# Patient Record
Sex: Male | Born: 1955 | Race: Black or African American | Hispanic: No | Marital: Married | Smoking: Former smoker
Health system: Southern US, Community
[De-identification: ages and names within clinical notes are randomized; demographics above are authoritative.]

## PROBLEM LIST (undated history)

## (undated) DIAGNOSIS — K449 Diaphragmatic hernia without obstruction or gangrene: Secondary | ICD-10-CM

## (undated) DIAGNOSIS — I10 Essential (primary) hypertension: Secondary | ICD-10-CM

## (undated) DIAGNOSIS — K589 Irritable bowel syndrome without diarrhea: Secondary | ICD-10-CM

## (undated) DIAGNOSIS — F32A Depression, unspecified: Secondary | ICD-10-CM

## (undated) DIAGNOSIS — F419 Anxiety disorder, unspecified: Secondary | ICD-10-CM

## (undated) DIAGNOSIS — K219 Gastro-esophageal reflux disease without esophagitis: Secondary | ICD-10-CM

## (undated) DIAGNOSIS — E785 Hyperlipidemia, unspecified: Secondary | ICD-10-CM

## (undated) DIAGNOSIS — K2970 Gastritis, unspecified, without bleeding: Secondary | ICD-10-CM

## (undated) DIAGNOSIS — R011 Cardiac murmur, unspecified: Secondary | ICD-10-CM

## (undated) DIAGNOSIS — F329 Major depressive disorder, single episode, unspecified: Secondary | ICD-10-CM

## (undated) DIAGNOSIS — E119 Type 2 diabetes mellitus without complications: Secondary | ICD-10-CM

## (undated) DIAGNOSIS — N529 Male erectile dysfunction, unspecified: Secondary | ICD-10-CM

## (undated) HISTORY — DX: Type 2 diabetes mellitus without complications: E11.9

## (undated) HISTORY — DX: Irritable bowel syndrome, unspecified: K58.9

## (undated) HISTORY — DX: Major depressive disorder, single episode, unspecified: F32.9

## (undated) HISTORY — PX: ESOPHAGOGASTRODUODENOSCOPY: SHX1529

## (undated) HISTORY — DX: Male erectile dysfunction, unspecified: N52.9

## (undated) HISTORY — DX: Cardiac murmur, unspecified: R01.1

## (undated) HISTORY — DX: Gastritis, unspecified, without bleeding: K29.70

## (undated) HISTORY — DX: Hyperlipidemia, unspecified: E78.5

## (undated) HISTORY — DX: Depression, unspecified: F32.A

## (undated) HISTORY — DX: Essential (primary) hypertension: I10

## (undated) HISTORY — DX: Gastro-esophageal reflux disease without esophagitis: K21.9

## (undated) HISTORY — PX: CARDIOVASCULAR STRESS TEST: SHX262

## (undated) HISTORY — DX: Anxiety disorder, unspecified: F41.9

## (undated) HISTORY — DX: Diaphragmatic hernia without obstruction or gangrene: K44.9

---

## 2003-01-27 ENCOUNTER — Encounter: Payer: Self-pay | Admitting: Internal Medicine

## 2004-09-14 ENCOUNTER — Ambulatory Visit: Payer: Self-pay | Admitting: Internal Medicine

## 2004-12-02 ENCOUNTER — Ambulatory Visit: Payer: Self-pay | Admitting: Internal Medicine

## 2004-12-14 ENCOUNTER — Ambulatory Visit: Payer: Self-pay | Admitting: Internal Medicine

## 2004-12-16 ENCOUNTER — Ambulatory Visit: Payer: Self-pay | Admitting: Critical Care Medicine

## 2005-02-01 ENCOUNTER — Ambulatory Visit: Payer: Self-pay | Admitting: Internal Medicine

## 2005-02-16 ENCOUNTER — Ambulatory Visit: Payer: Self-pay | Admitting: Critical Care Medicine

## 2005-03-16 ENCOUNTER — Ambulatory Visit: Payer: Self-pay | Admitting: Critical Care Medicine

## 2005-03-17 ENCOUNTER — Encounter: Payer: Self-pay | Admitting: Internal Medicine

## 2005-03-27 ENCOUNTER — Ambulatory Visit: Payer: Self-pay | Admitting: Internal Medicine

## 2005-08-30 ENCOUNTER — Ambulatory Visit: Payer: Self-pay | Admitting: Internal Medicine

## 2005-09-05 ENCOUNTER — Ambulatory Visit (HOSPITAL_COMMUNITY): Admission: RE | Admit: 2005-09-05 | Discharge: 2005-09-05 | Payer: Self-pay | Admitting: Gastroenterology

## 2005-09-05 ENCOUNTER — Ambulatory Visit: Payer: Self-pay | Admitting: Gastroenterology

## 2005-09-05 ENCOUNTER — Encounter: Payer: Self-pay | Admitting: Internal Medicine

## 2005-09-28 ENCOUNTER — Ambulatory Visit: Payer: Self-pay | Admitting: Gastroenterology

## 2005-09-28 ENCOUNTER — Encounter (INDEPENDENT_AMBULATORY_CARE_PROVIDER_SITE_OTHER): Payer: Self-pay | Admitting: Specialist

## 2005-09-28 DIAGNOSIS — K449 Diaphragmatic hernia without obstruction or gangrene: Secondary | ICD-10-CM

## 2005-09-28 HISTORY — DX: Diaphragmatic hernia without obstruction or gangrene: K44.9

## 2005-10-12 ENCOUNTER — Ambulatory Visit: Payer: Self-pay | Admitting: Internal Medicine

## 2005-12-11 ENCOUNTER — Ambulatory Visit: Payer: Self-pay | Admitting: Internal Medicine

## 2006-04-13 ENCOUNTER — Ambulatory Visit: Payer: Self-pay | Admitting: Internal Medicine

## 2006-06-19 ENCOUNTER — Ambulatory Visit: Payer: Self-pay | Admitting: Internal Medicine

## 2007-05-10 DIAGNOSIS — F329 Major depressive disorder, single episode, unspecified: Secondary | ICD-10-CM

## 2007-05-10 DIAGNOSIS — K589 Irritable bowel syndrome without diarrhea: Secondary | ICD-10-CM

## 2007-05-10 DIAGNOSIS — E785 Hyperlipidemia, unspecified: Secondary | ICD-10-CM | POA: Insufficient documentation

## 2007-05-23 ENCOUNTER — Ambulatory Visit: Payer: Self-pay | Admitting: Internal Medicine

## 2007-05-23 DIAGNOSIS — R002 Palpitations: Secondary | ICD-10-CM | POA: Insufficient documentation

## 2007-05-23 DIAGNOSIS — F419 Anxiety disorder, unspecified: Secondary | ICD-10-CM | POA: Insufficient documentation

## 2007-05-25 ENCOUNTER — Encounter: Payer: Self-pay | Admitting: Internal Medicine

## 2007-09-13 ENCOUNTER — Ambulatory Visit: Payer: Self-pay | Admitting: Internal Medicine

## 2007-09-13 DIAGNOSIS — K625 Hemorrhage of anus and rectum: Secondary | ICD-10-CM

## 2007-11-04 ENCOUNTER — Telehealth (INDEPENDENT_AMBULATORY_CARE_PROVIDER_SITE_OTHER): Payer: Self-pay | Admitting: *Deleted

## 2008-07-28 ENCOUNTER — Ambulatory Visit: Payer: Self-pay | Admitting: Family Medicine

## 2008-07-28 DIAGNOSIS — R209 Unspecified disturbances of skin sensation: Secondary | ICD-10-CM

## 2008-07-28 DIAGNOSIS — IMO0002 Reserved for concepts with insufficient information to code with codable children: Secondary | ICD-10-CM | POA: Insufficient documentation

## 2008-07-28 LAB — CONVERTED CEMR LAB

## 2008-07-29 LAB — CONVERTED CEMR LAB
ALT: 36 units/L (ref 0–53)
Basophils Absolute: 0 10*3/uL (ref 0.0–0.1)
Basophils Relative: 0.7 % (ref 0.0–3.0)
Bilirubin, Direct: 0.1 mg/dL (ref 0.0–0.3)
CO2: 29 meq/L (ref 19–32)
Calcium: 9.1 mg/dL (ref 8.4–10.5)
Cholesterol, target level: 200 mg/dL
Cholesterol: 247 mg/dL (ref 0–200)
Creatinine, Ser: 0.9 mg/dL (ref 0.4–1.5)
Direct LDL: 175.9 mg/dL
Eosinophils Absolute: 0.2 10*3/uL (ref 0.0–0.7)
GFR calc Af Amer: 114 mL/min
GFR calc non Af Amer: 94 mL/min
HDL goal, serum: 40 mg/dL
HDL: 40.9 mg/dL (ref >39.0)
Hemoglobin: 14 g/dL (ref 13.0–17.0)
LDL Goal: 160 mg/dL
Lymphocytes Relative: 34.3 % (ref 12.0–46.0)
MCHC: 35.3 g/dL (ref 30.0–36.0)
MCV: 85.4 fL (ref 78.0–100.0)
Neutro Abs: 3.4 10*3/uL (ref 1.4–7.7)
PSA: 0.49 ng/mL (ref 0.10–4.00)
RBC: 4.64 M/uL (ref 4.22–5.81)
RDW: 12.9 % (ref 11.5–14.6)
Sodium: 139 meq/L (ref 135–145)
Total Bilirubin: 0.9 mg/dL (ref 0.3–1.2)
VLDL: 37 mg/dL (ref 0–40)

## 2008-12-14 ENCOUNTER — Telehealth: Payer: Self-pay | Admitting: Internal Medicine

## 2009-02-23 ENCOUNTER — Telehealth: Payer: Self-pay | Admitting: Internal Medicine

## 2009-12-28 ENCOUNTER — Ambulatory Visit: Payer: Self-pay | Admitting: Internal Medicine

## 2009-12-28 DIAGNOSIS — M25519 Pain in unspecified shoulder: Secondary | ICD-10-CM | POA: Insufficient documentation

## 2009-12-29 LAB — CONVERTED CEMR LAB: Free T4: 0.7 ng/dL (ref 0.6–1.6)

## 2010-05-05 ENCOUNTER — Ambulatory Visit: Payer: Self-pay | Admitting: Internal Medicine

## 2010-05-05 DIAGNOSIS — N529 Male erectile dysfunction, unspecified: Secondary | ICD-10-CM | POA: Insufficient documentation

## 2010-05-09 LAB — CONVERTED CEMR LAB
ALT: 29 units/L (ref 0–53)
AST: 26 units/L (ref 0–37)
Albumin: 4.4 g/dL (ref 3.5–5.2)
Basophils Absolute: 0 10*3/uL (ref 0.0–0.1)
Basophils Relative: 0.2 % (ref 0.0–3.0)
Calcium: 9.3 mg/dL (ref 8.4–10.5)
Creatinine, Ser: 0.9 mg/dL (ref 0.4–1.5)
Direct LDL: 116.6 mg/dL
Eosinophils Absolute: 0.2 10*3/uL (ref 0.0–0.7)
Glucose, Bld: 85 mg/dL (ref 70–99)
HDL: 39.8 mg/dL (ref >39.00)
Lymphocytes Relative: 35.7 % (ref 12.0–46.0)
MCHC: 33.5 g/dL (ref 30.0–36.0)
Neutrophils Relative %: 52.5 % (ref 43.0–77.0)
PSA: 0.47 ng/mL (ref 0.10–4.00)
Phosphorus: 3.4 mg/dL (ref 2.3–4.6)
Platelets: 220 10*3/uL (ref 150.0–400.0)
RBC: 4.4 M/uL (ref 4.22–5.81)
Sodium: 141 meq/L (ref 135–145)
TSH: 1.13 microintl units/mL (ref 0.35–5.50)
Total CHOL/HDL Ratio: 4
VLDL: 53 mg/dL — ABNORMAL HIGH (ref 0.0–40.0)

## 2010-10-13 ENCOUNTER — Encounter: Payer: Self-pay | Admitting: Internal Medicine

## 2010-10-13 DIAGNOSIS — Z0279 Encounter for issue of other medical certificate: Secondary | ICD-10-CM

## 2010-10-25 NOTE — Assessment & Plan Note (Signed)
Summary: ARM PAIN/CLE   Vital Signs:  Patient profile:   55 year old male Weight:      219 pounds BMI:     29.81 Temp:     98.2 degrees F oral Pulse rate:   80 / minute Pulse rhythm:   regular BP sitting:   128 / 80  (left arm) Cuff size:   large  Vitals Entered By: Mervin Hack CMA Duncan Dull) (December 28, 2009 9:06 AM) CC: arm pain   History of Present Illness: Having pain in left shoulder for 3-4 months constant but worsens with movement--esp outward Hard to hold any heavy objects Occ numbness in 4th and 5th fingers--esp when driving  No known injury No prior problems No swelling  Tried muscle rub, acetominophen---help some No arm weakness  Mood has been okay still on antidepressants  Stopped chol meds Rx ran out  Still has burning sensation in both thighs amitriptylline doesn't seem to help Not every day  Not exercising now has lots of fatigue does have elliptical but hasn't started using  Allergies: No Known Drug Allergies  Past History:  Past medical, surgical, family and social histories (including risk factors) reviewed for relevance to current acute and chronic problems.  Past Medical History: Reviewed history from 05/23/2007 and no changes required. Depression GERD Hyperlipidemia IBS Anxiety  Past Surgical History: Reviewed history from 05/10/2007 and no changes required. Stress test negative 06/04 PFT's- fairly normal 05/06 EGD- gastritis, hiatal hernia 01/07  Family History: Reviewed history from 05/10/2007 and no changes required. 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   Neck:  supple and no masses.  Mild restriction in extension Lungs:  normal respiratory effort and normal breath sounds.   Heart:  normal rate, regular rhythm, no murmur, and no gallop.   Msk:  left shoulder has slightly decreased passive abduction and posterior ROM No swelling No bursa tenderness No impingement Neurologic:  no arm weakness gait normal.   Psych:  normally interactive, good eye contact, not anxious appearing, and not depressed appearing.     Impression & Recommendations:  Problem # 1:  SHOULDER PAIN, LEFT (ICD-719.41) Assessment New mild restriction No evidence of serious injury will try heat and NSAIDs for 2 weeks PT eval if not improved  Problem # 2:  UNSPECIFIED NEURALGIA NEURITIS AND RADICULITIS (ICD-729.2) Assessment: Unchanged  intermittent will stop the amitriptylline since dry mouth is keeping him up will check thyroid and B12 just in case  Orders: Venipuncture ([address]) TLB-TSH (Thyroid Stimulating Hormone) (84443-TSH) TLB-B12, Serum-Total ONLY (60454-U98) TLB-T4 (Thyrox), Free [phone])  Problem # 3:  DEPRESSION (ICD-311) Assessment: Unchanged stable on current meds  The following medications were removed from the medication list:    Amitriptyline Hcl 25 Mg Tabs (Amitriptyline hcl) .Marland Kitchen... Take one tablet at bedtime His updated medication list for this problem includes:    Fluoxetine Hcl 40 Mg Caps (Fluoxetine hcl) .Marland Kitchen... Take one by mouth once a day    Lorazepam 0.5 Mg Tabs (Lorazepam) .Marland Kitchen... Take 1 tablet by  mouth twice a day  Problem # 4:  HYPERLIPIDEMIA (ICD-272.4) Assessment: Comment Only will restart meds check labs at physical  His updated medication list for this problem includes:    Simvastatin 40 Mg Tabs (Simvastatin) .Marland Kitchen... Take one tablet at bedtime  Complete Medication List: 1)  Fluoxetine Hcl 40 Mg Caps (Fluoxetine hcl) .... Take one  by mouth once a day 2)  Lorazepam 0.5 Mg Tabs (Lorazepam) .... Take 1 tablet by mouth twice a day 3)  Simvastatin 40 Mg Tabs (Simvastatin) .... Take one tablet at bedtime  Patient Instructions: 1)  Please try heat to your left shoulder---esp along the back portion. 2)  Try ibuprofen 200mg   ---   3 after each meal for the next 2 weeks. If your shoulder is not much better after the 2 weeks, please call for a referral for physical therapy 3)  Please schedule a follow-up appointment in 3-4  months for a physical Prescriptions: SIMVASTATIN 40 MG TABS (SIMVASTATIN) take one tablet at bedtime  #30 x 12   Entered and Authorized by:   Cindee Salt MD   Signed by:   Cindee Salt MD on 12/28/2009   Method used:   Electronically to        [facility] [address] [facility] [address] [id number]* (retail)       [address] [place], [place]  [address]       Ph: [phone]       Fax: [phone]   RxID:   [id number]   Current Allergies (reviewed today): No known allergies

## 2010-10-25 NOTE — Progress Notes (Signed)
Summary: FLUOXETINE  Phone Note Refill Request Message from:  [facility] [phone] on December 14, 2008 11:17 AM  Refills Requested: Medication #1:  FLUOXETINE HCL 40 MG CAPS Take one by mouth once a day   Last Refilled: 09/02/2008 e-scribe request   Method Requested: Telephone to Pharmacy Initial call taken by: Mervin Hack CMA,  December 14, 2008 11:17 AM  Follow-up for Phone Call        1 year Rx given by Dr Patsy Lager in November check with patient why pharmacy is requesting Follow-up by: Cindee Salt MD,  December 14, 2008 1:53 PM  Additional Follow-up for Phone Call Additional follow up Details #1::        spoke with pharmacist, they do not have rx from 11/09, the only one they have is from 08/09. I also left message on machine for patient to return call.  DeShannon Katrinka Blazing CMA  December 14, 2008 2:28 PM  left message with daughter for patient to return call. DeShannon Smith CMA  December 14, 2008 5:21 PM     left message on machine for patient to return call.  DeShannon Smith CMA  December 16, 2008 1:21 PM     Additional Follow-up for Phone Call Additional follow up Details #2::    patient finally called back and states that he did not use the rx from Dr. Patsy Lager and would like a refill, he will try and use the old at the pharmacy. I told him it want work, so he will have the pharmacy call us. Can I refill? DeShannon Smith CMA  December 18, 2008 3:39 PM   okay to refill for 1 year Cindee Salt MD  December 18, 2008 4:55 PM     Prescriptions: FLUOXETINE HCL 40 MG CAPS (FLUOXETINE HCL) Take one by mouth once a day  #90 x 3   Entered by:   Mervin Hack CMA   Authorized by:   Cindee Salt MD   Signed by:   Mervin Hack CMA on 12/21/2008   Method used:   Electronically to        [facility] [id number]* (retail)       [address] [place], [place]  [address]       Ph: [phone]       Fax: [phone]   RxID:   [id number]

## 2010-10-25 NOTE — Assessment & Plan Note (Signed)
Summary: CPX/CLE   Vital Signs:  Patient profile:   55 year old male Weight:      213 pounds BMI:     28.99 Temp:     98.3 degrees F oral Pulse rate:   72 / minute Pulse rhythm:   regular BP sitting:   120 / 80  (left arm) Cuff size:   large  Vitals Entered By: Mervin Hack CMA Duncan Dull) (May 05, 2010 3:28 PM)  Nutrition Counseling: Patient's BMI is greater than 25 and therefore counseled on weight management options. 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  Past Medical History: Depression GERD Hyperlipidemia IBS Anxiety Erectile dsyfunction  Past Surgical History: Reviewed history from 05/10/2007 and no changes required. Stress test negative 06/04 PFT's- fairly normal 05/06 EGD- gastritis, hiatal hernia 01/07  Family History: Reviewed history from 05/10/2007 and no changes required. Mom died @75  MI Dad died @72  CVA DM and HTN in family No colon or prostate cancer  Social History: Reviewed history from 12/28/2009 and no changes required.  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 Eyes:  Denies double vision and vision loss-1 eye. ENT:  Denies decreased hearing and ringing in ears; teeth okay---regular with dentist. CV:  Denies chest pain or discomfort, difficulty breathing at night, difficulty breathing while lying down, fainting, lightheadness, palpitations, and shortness of breath with exertion. Resp:  Denies cough and shortness of breath. GI:  Denies abdominal pain, bloody stools, change in bowel habits, dark tarry stools, indigestion, nausea, and vomiting; prilosec controls heartburn . GU:  Complains of decreased libido and erectile dysfunction; denies urinary frequency and urinary hesitancy. MS:  Complains of joint pain; denies joint swelling; occ left shoulder pain---uses occ ibuprofen. Has improved now. Derm:  Denies lesion(s) and rash. Neuro:  Complains of tingling; denies headaches, numbness, and weakness; some burning and tingling occ in right knee. Psych:  Complains of anxiety and depression. Heme:  Denies abnormal bruising and enlarge lymph nodes. Allergy:  Denies seasonal allergies and sneezing.  Physical Exam  General:  alert and normal appearance.   Eyes:  pupils equal, pupils round, pupils reactive to light, and no optic disk abnormalities.   Ears:  R ear normal and L ear normal.   Mouth:  pharynx pink and moist, no erythema, no exudates, and no lesions.   Neck:  supple, no masses, no thyromegaly, no carotid bruits, and no cervical lymphadenopathy.   Lungs:  normal respiratory effort, no intercostal retractions, no accessory muscle use, and normal breath sounds.   Heart:  normal rate, regular rhythm, no murmur, and no gallop.   Abdomen:  soft, non-tender, and no masses.   Rectal:  no hemorrhoids and no masses.   Prostate:  no gland enlargement and no nodules.   Msk:  no joint tenderness and no joint swelling.   Pulses:  1+ in feet Extremities:  no edema Neurologic:  alert & oriented X3,  strength normal in all extremities, and gait normal.   Skin:  no rashes and no suspicious lesions.   Axillary Nodes:  No palpable lymphadenopathy Psych:  normally interactive, good eye contact, not anxious appearing, and not depressed appearing.     Impression & Recommendations:  Problem # 1:  HEALTH MAINTENANCE EXAM (ICD-V70.0) Assessment Comment Only healthy discussed fitness and eating right Due for PSA  Problem # 2:  DEPRESSION (ICD-311) Assessment: Deteriorated mildly worse some help with lorazepam in past will try trazodone for sleep----back to lorazepam if that doesn't help  The following medications were removed from the medication list:    Lorazepam 0.5 Mg Tabs (Lorazepam) .Marland Kitchen... Take 1 tablet by mouth twice a day His updated medication list for this problem includes:    Fluoxetine Hcl 40 Mg Caps (Fluoxetine hcl) .Marland Kitchen... Take one by mouth once a day    Trazodone Hcl 100 Mg Tabs (Trazodone hcl) .Marland Kitchen... 1-2 tabs at bedtime to help sleep and mood  Problem # 3:  ORGANIC IMPOTENCE (ICD-607.84) Assessment: Deteriorated trial again of meds  His updated medication list for this problem includes:    Levitra 20 Mg Tabs (Vardenafil hcl) .Marland Kitchen... Take 1 tab about 30-60 minutes before sex  Problem # 4:  HYPERLIPIDEMIA (ICD-272.4) Assessment: Unchanged  due for labs  His updated medication list for this problem includes:    Simvastatin 40 Mg Tabs (Simvastatin) .Marland Kitchen... 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 1 daily for heartburn  Orders: TLB-Renal Function Panel (80069-RENAL) TLB-CBC Platelet - w/Differential (85025-CBCD) TLB-TSH (Thyroid Stimulating Hormone) (84443-TSH)  Complete Medication List: 1)  Fluoxetine Hcl 40 Mg Caps (Fluoxetine hcl) .... Take one by mouth once a day 2)  Simvastatin 40 Mg Tabs (Simvastatin) .... Take one tablet at bedtime 3)  Omeprazole 20 Mg Tbec (Omeprazole) .Marland Kitchen.. 1 daily for heartburn 4)  Levitra 20 Mg Tabs (Vardenafil hcl) .... Take 1 tab about 30-60 minutes before sex 5)  Trazodone Hcl 100 Mg Tabs (Trazodone hcl) .Marland Kitchen.. 1-2 tabs at bedtime to help sleep and mood  Other Orders: TLB-PSA (Prostate Specific Antigen) (84153-PSA)  Patient Instructions: 1)  Please start the trazodone to help sleep. Start with 1/2 tab first and increase to a full tab after 2 nights. If still having trouble after 1-2 weeks on the full tab, try 2 tabs at bedtime 2)  Please schedule a follow-up appointment in 3 months .  Prescriptions: TRAZODONE HCL 100 MG TABS (TRAZODONE HCL) 1-2 tabs at bedtime to help sleep and mood  #60 x 11   Entered and Authorized by:   Cindee Salt MD  Signed by:   Cindee Salt MD on 05/05/2010   Method used:   Electronically to        [id number]* (retail)       [address] [place], [place]  [address]       Ph: [phone]       Fax: [phone]   RxID:   [id number] LEVITRA 20 MG TABS (VARDENAFIL HCL) Take 1 tab about 30-60 minutes before sex  #5 x 10   Entered and Authorized by:   Cindee Salt MD   Signed by:   Cindee Salt MD on 05/05/2010   Method used:   Electronically to        [facility] [address] (retail)       [phone]       [place], [place]  [address]       Ph: [phone]       Fax: [phone]    RxID:   [id number]   Current Allergies (reviewed today): No known allergies

## 2010-10-27 NOTE — Letter (Signed)
Summary: Medical Form for [place] [facility]  Medical Form for [place] [facility]   Imported By: Maryln Gottron 10/18/2010 13:55:53  _____________________________________________________________________  External Attachment:    Type:   Image     Comment:   External Document

## 2011-01-11 ENCOUNTER — Other Ambulatory Visit: Payer: Self-pay | Admitting: Internal Medicine

## 2011-03-09 ENCOUNTER — Other Ambulatory Visit: Payer: Self-pay | Admitting: Internal Medicine

## 2011-04-28 ENCOUNTER — Other Ambulatory Visit: Payer: Self-pay | Admitting: Internal Medicine

## 2011-05-01 NOTE — Telephone Encounter (Signed)
Okay to fill for 3 months and he needs to schedule a physical before the end of that time

## 2011-05-02 NOTE — Telephone Encounter (Signed)
rx sent to pharmacy by e-script  

## 2011-05-22 ENCOUNTER — Ambulatory Visit (INDEPENDENT_AMBULATORY_CARE_PROVIDER_SITE_OTHER): Payer: No Typology Code available for payment source | Admitting: Internal Medicine

## 2011-05-22 ENCOUNTER — Encounter: Payer: Self-pay | Admitting: Internal Medicine

## 2011-05-22 VITALS — BP 146/56 | HR 59 | Temp 98.0°F | Ht 72.0 in | Wt 213.0 lb

## 2011-05-22 DIAGNOSIS — K625 Hemorrhage of anus and rectum: Secondary | ICD-10-CM

## 2011-05-22 NOTE — Progress Notes (Signed)
  Subjective:    Patient ID: Alejandro Hoover, male    DOB: [birth year], 55 y.o.   MRN: [id number]  HPI For 2 weeks has been passing blood in stool He feels it is a lot No increase in stools---goes once every 3 days Sees red blood in toilet and clots as well as on the toilet paper  No sig pain with bowel movements No mucus No abdominal pain Colonoscopy in 2007 showed just some polpys  Hasn't tried any Rx  No current outpatient prescriptions on file prior to visit.    No Known Allergies  Past Medical History  Diagnosis Date  . Depression   . GERD (gastroesophageal reflux disease)   . Hyperlipidemia   . IBS (irritable bowel syndrome)   . Anxiety   . ED (erectile dysfunction)     Past Surgical History  Procedure Date  . Cardiovascular stress test     neg  . Esophagogastroduodenoscopy     Family History  Problem Relation Age of Onset  . Cancer Neg Hx     History   Social History  . Marital Status: Married    Spouse Name: N/A    Number of Children: 2  . Years of Education: N/A   Occupational History  . retired Research scientist (physical sciences)   . now does foster care    Social History Main Topics  . Smoking status: Former Smoker    Types: Cigarettes    Quit date: 09/25/1968  . Smokeless tobacco: Never Used  . Alcohol Use: Yes  . Drug Use: No  . Sexually Active: Not on file   Other Topics Concern  . Not on file   Social History Narrative  . No narrative on file   Review of Systems No nausea or vomiting No weight change Appetite is fine    Objective:   Physical Exam  Constitutional: He appears well-developed and well-nourished. No distress.  Abdominal: Soft. There is no tenderness.  Genitourinary:       No hemorrhoids or apparent fissures No fistula seen Internal negative as well No obvious prostate nodules          Assessment & Plan:

## 2011-05-22 NOTE — Assessment & Plan Note (Signed)
Sig amounts over 2 weeks--with each stool No melena No obvious source Last colonoscopy 5.5 years ago---can't find report though  Will send back to Dr Tennis Must needs to be rechecked given the bleeding

## 2011-05-22 NOTE — Patient Instructions (Addendum)
Please set up the GI appointment Please schedule physical here at your earliest convenience

## 2011-05-30 ENCOUNTER — Encounter: Payer: Self-pay | Admitting: Gastroenterology

## 2011-05-30 ENCOUNTER — Ambulatory Visit (INDEPENDENT_AMBULATORY_CARE_PROVIDER_SITE_OTHER): Payer: No Typology Code available for payment source | Admitting: Gastroenterology

## 2011-05-30 VITALS — BP 132/64 | HR 88 | Ht 72.0 in | Wt 214.0 lb

## 2011-05-30 DIAGNOSIS — K644 Residual hemorrhoidal skin tags: Secondary | ICD-10-CM

## 2011-05-30 NOTE — Progress Notes (Signed)
HPI: This is a  very pleasant 55 year old man whom I last saw about 5 years ago  EGD in 2007 found H. pylori positive gastritis  In past 3-4 weeks, he has had some bright red blood in stools.  This occurred with every BM for 2 weeks, then improved until yesterday.  +slight anal discomfort, constipated.  He gets constpated periodically, doesn't take anything for it.  He will sometimes only have a BM once a week.  Lately not more that his usual  He had a colonoscopy January 2007 that was completely normal. He also had an EGD in 2007 and found H. pylori positive gastritis.   Pertinent positive and negative review of systems were noted in the above HPI section.  All other review of systems was otherwise negative.   Past Medical History  Diagnosis Date  . Depression   . GERD (gastroesophageal reflux disease)   . Hyperlipidemia   . IBS (irritable bowel syndrome)   . Anxiety   . ED (erectile dysfunction)   . Hiatal hernia 09-28-2005  . Gastritis     mild     Past Surgical History  Procedure Date  . Cardiovascular stress test     neg  . Esophagogastroduodenoscopy      reports that he quit smoking about 42 years ago. His smoking use included Cigarettes. He has never used smokeless tobacco. He reports that he drinks alcohol. He reports that he does not use illicit drugs.  family history includes Diabetes in his mother; Heart disease in his mother; Heart failure in his daughters; and Liver disease in an unspecified family member.  There is no history of Colon cancer.    Current Medications, Allergies were all reviewed with the patient via [facility] electronic medical record system.    Physical Exam: BP 132/64  Pulse 88  Ht 6' (1.829 m)  Wt 214 lb (97.07 kg)  BMI 29.02 kg/m2 Constitutional: generally well-appearing Psychiatric: alert and oriented x3 Eyes: extraocular movements intact Mouth: oral pharynx moist, no lesions Neck: supple no lymphadenopathy Cardiovascular:  heart regular rate and rhythm Lungs: clear to auscultation bilaterally Abdomen: soft, nontender, nondistended, no obvious ascites, no peritoneal signs, normal bowel sounds Extremities: no lower extremity edema bilaterally Skin: no lesions on visible extremities Rectal examination: Small external hemorrhoid that was not thrombosed, stool was brown no masses in the distal rectum,    Assessment and plan: 55 y.o. male withMinor rectal bleeding, likely hemorrhoidal  He has abnormal bowel pattern with significant intermittent constipation and straining. I suspect this is contributing to his hemorrhoidal, anal rectal problem. He will start fiber supplement on a daily basis and he will call in 2 months to report on his symptoms.

## 2011-05-30 NOTE — Patient Instructions (Addendum)
Please start taking citrucel (orange flavored) powder fiber supplement.  This may cause some bloating at first but that usually goes away. Begin with a small spoonful and work your way up to a large, heaping spoonful daily over a week. Please call in 2 months to report on your symptoms, response to fiber supplements

## 2011-07-03 ENCOUNTER — Other Ambulatory Visit: Payer: Self-pay | Admitting: Internal Medicine

## 2011-08-24 ENCOUNTER — Encounter: Payer: Self-pay | Admitting: Internal Medicine

## 2011-08-24 ENCOUNTER — Ambulatory Visit (INDEPENDENT_AMBULATORY_CARE_PROVIDER_SITE_OTHER): Payer: No Typology Code available for payment source | Admitting: Internal Medicine

## 2011-08-24 DIAGNOSIS — Z0000 Encounter for general adult medical examination without abnormal findings: Secondary | ICD-10-CM

## 2011-08-24 DIAGNOSIS — R030 Elevated blood-pressure reading, without diagnosis of hypertension: Secondary | ICD-10-CM

## 2011-08-24 DIAGNOSIS — E785 Hyperlipidemia, unspecified: Secondary | ICD-10-CM

## 2011-08-24 DIAGNOSIS — F329 Major depressive disorder, single episode, unspecified: Secondary | ICD-10-CM

## 2011-08-24 DIAGNOSIS — F3289 Other specified depressive episodes: Secondary | ICD-10-CM

## 2011-08-24 LAB — PSA: PSA: 0.45 ng/mL (ref 0.10–4.00)

## 2011-08-24 LAB — HEPATIC FUNCTION PANEL
ALT: 39 U/L (ref 0–53)
Alkaline Phosphatase: 77 U/L (ref 39–117)
Bilirubin, Direct: 0.1 mg/dL (ref 0.0–0.3)
Total Bilirubin: 0.3 mg/dL (ref 0.3–1.2)
Total Protein: 7.7 g/dL (ref 6.0–8.3)

## 2011-08-24 LAB — BASIC METABOLIC PANEL
Calcium: 9.4 mg/dL (ref 8.4–10.5)
Creatinine, Ser: 1.1 mg/dL (ref 0.4–1.5)
GFR: 89.92 mL/min (ref >60.00)
Sodium: 141 mEq/L (ref 135–145)

## 2011-08-24 LAB — CBC WITH DIFFERENTIAL/PLATELET
Basophils Absolute: 0 10*3/uL (ref 0.0–0.1)
Basophils Relative: 0.4 % (ref 0.0–3.0)
Hemoglobin: 13.5 g/dL (ref 13.0–17.0)
Lymphocytes Relative: 34.1 % (ref 12.0–46.0)
Monocytes Relative: 10.3 % (ref 3.0–12.0)
Neutro Abs: 3.4 10*3/uL (ref 1.4–7.7)
Neutrophils Relative %: 50 % (ref 43.0–77.0)
RBC: 4.6 Mil/uL (ref 4.22–5.81)
WBC: 6.9 10*3/uL (ref 4.5–10.5)

## 2011-08-24 LAB — MICROALBUMIN / CREATININE URINE RATIO
Creatinine,U: 287.8 mg/dL
Microalb Creat Ratio: 2.2 mg/g (ref 0.0–30.0)
Microalb, Ur: 6.2 mg/dL — ABNORMAL HIGH (ref 0.0–1.9)

## 2011-08-24 LAB — LIPID PANEL: Triglycerides: 298 mg/dL — ABNORMAL HIGH (ref 0.0–149.0)

## 2011-08-24 NOTE — Assessment & Plan Note (Signed)
No results found for this basename: LDLCALC   Will continue med Good response

## 2011-08-24 NOTE — Assessment & Plan Note (Signed)
Healthy but out of shape Discussed fitness Will check PSA after discussion

## 2011-08-24 NOTE — Progress Notes (Signed)
Subjective:    Patient ID: Alejandro Hoover, male    DOB: 1956/08/28, 55 y.o.   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 FLUoxetine (PROZAC) 40 MG capsule Take 40 mg by mouth daily.        Marland Kitchen omeprazole (PRILOSEC) 20 MG capsule Take 20 mg by mouth daily.        . simvastatin (ZOCOR) 40 MG tablet Take 40 mg by mouth at bedtime.        . traZODone (DESYREL) 100 MG tablet TAKE 1-2 TABLETS BY MOUTH AT BEDTIME TO HELP SLEEP AND MOOD  60 tablet  0  . vardenafil (LEVITRA) 20 MG tablet Take 20 mg by mouth daily as needed.          No Known Allergies  Past Medical History  Diagnosis Date  . Depression   . GERD (gastroesophageal reflux disease)   . Hyperlipidemia   . IBS (irritable bowel syndrome)   . Anxiety   . ED (erectile dysfunction)   . Hiatal hernia 09-28-2005  . Gastritis     mild     Past Surgical History  Procedure Date  . Cardiovascular stress test     neg  . Esophagogastroduodenoscopy     Family History  Problem Relation Age of Onset  . Colon cancer Neg Hx   . Diabetes Mother   . Heart disease Mother   . Liver disease      3 siblings  . Heart failure Daughter   . Heart failure Daughter     History   Social History  . Marital Status: Married    Spouse Name: N/A    Number of Children: 2  . Years of Education: N/A   Occupational History  . retired Research scientist (physical sciences)   . now does foster care    Social History Main Topics  . Smoking status: Former Smoker    Types: Cigarettes    Quit date: 09/25/1968  . Smokeless tobacco: Never Used  . Alcohol Use: Yes     5-6 drinks a week   . Drug  Use: No  . Sexually Active: Not on file   Other Topics Concern  . Not on file   Social History Narrative  . No narrative on file   Review of Systems  Constitutional: Positive for unexpected weight change. Negative for fatigue.       Weight up 6# since last year Wears seat belt  HENT: Negative for hearing loss, congestion, rhinorrhea, dental problem and tinnitus.        Regular with dentist  Eyes: Negative for visual disturbance.       No diplopia or unilateral vision loss Some itchy, watery eyes---due for check up  Respiratory: Negative for cough and shortness of breath.   Cardiovascular: Negative for chest pain, palpitations and leg swelling.  Gastrointestinal: Positive for constipation. Negative for nausea, vomiting and abdominal pain.       Rectal bleeding is better--int hemorrhoids Using fiber supplement Rarely gets heartburn on the omeprazole  Genitourinary: Negative for dysuria, urgency and difficulty urinating.       No nocturia Does  have some decreased libido Not really ED  Musculoskeletal: Positive for arthralgias. Negative for back pain and joint swelling.       Still occ left arm/shoulder pain and trouble abducting  Skin: Negative for rash.       No suspicious areas  Neurological: Negative for dizziness, syncope, weakness, light-headedness, numbness and headaches.  Hematological: Negative for adenopathy. Bruises/bleeds easily.  Psychiatric/Behavioral: Negative for sleep disturbance and dysphoric mood. The patient is nervous/anxious.        Depression controlled occ anxious feelings--able to manage it       Objective:   Physical Exam  Constitutional: He is oriented to person, place, and time. He appears well-developed and well-nourished. No distress.  HENT:  Head: Normocephalic and atraumatic.  Right Ear: External ear normal.  Left Ear: External ear normal.  Mouth/Throat: Oropharynx is clear and moist. No oropharyngeal exudate.       TMs normal  Eyes:  Conjunctivae and EOM are normal. Pupils are equal, round, and reactive to light.       Fundi benign  Neck: Normal range of motion. Neck supple. No thyromegaly present.  Cardiovascular: Normal rate, regular rhythm, normal heart sounds and intact distal pulses.  Exam reveals no gallop.   No murmur heard. Pulmonary/Chest: Effort normal and breath sounds normal. No respiratory distress. He has no wheezes. He has no rales.  Abdominal: Soft. There is no tenderness.  Musculoskeletal: Normal range of motion. He exhibits no edema and no tenderness.  Lymphadenopathy:    He has no cervical adenopathy.  Neurological: He is alert and oriented to person, place, and time.  Skin:       Multiple benign nevi Hyperpigmented areas on shins (from past bruises)  Psychiatric: He has a normal mood and affect. His behavior is normal. Judgment and thought content normal.          Assessment & Plan:

## 2011-08-24 NOTE — Assessment & Plan Note (Signed)
Controlled with the med Will continue Trazodone for sleep

## 2011-08-24 NOTE — Assessment & Plan Note (Signed)
BP Readings from Last 3 Encounters:  08/24/11 151/57  05/30/11 132/64  05/22/11 146/56   Up more today Will check labs and urine microal (rx if positive--lisinopril) Discussed fitness May need meds if doesn't go down

## 2011-08-29 ENCOUNTER — Other Ambulatory Visit: Payer: Self-pay | Admitting: Internal Medicine

## 2011-11-14 ENCOUNTER — Other Ambulatory Visit: Payer: Self-pay | Admitting: Internal Medicine

## 2012-02-20 ENCOUNTER — Ambulatory Visit: Payer: No Typology Code available for payment source | Admitting: Internal Medicine

## 2012-02-20 DIAGNOSIS — Z0289 Encounter for other administrative examinations: Secondary | ICD-10-CM

## 2012-03-26 ENCOUNTER — Other Ambulatory Visit: Payer: Self-pay | Admitting: Internal Medicine

## 2012-07-28 ENCOUNTER — Other Ambulatory Visit: Payer: Self-pay | Admitting: Internal Medicine

## 2012-09-13 ENCOUNTER — Ambulatory Visit (INDEPENDENT_AMBULATORY_CARE_PROVIDER_SITE_OTHER): Payer: No Typology Code available for payment source | Admitting: Internal Medicine

## 2012-09-13 ENCOUNTER — Encounter: Payer: Self-pay | Admitting: Internal Medicine

## 2012-09-13 VITALS — BP 138/80 | HR 59 | Temp 98.1°F | Ht 72.0 in | Wt 214.0 lb

## 2012-09-13 DIAGNOSIS — E785 Hyperlipidemia, unspecified: Secondary | ICD-10-CM

## 2012-09-13 DIAGNOSIS — F329 Major depressive disorder, single episode, unspecified: Secondary | ICD-10-CM

## 2012-09-13 DIAGNOSIS — Z111 Encounter for screening for respiratory tuberculosis: Secondary | ICD-10-CM

## 2012-09-13 DIAGNOSIS — Z0000 Encounter for general adult medical examination without abnormal findings: Secondary | ICD-10-CM

## 2012-09-13 LAB — CBC WITH DIFFERENTIAL/PLATELET
Basophils Relative: 0.4 % (ref 0.0–3.0)
Eosinophils Relative: 1.9 % (ref 0.0–5.0)
Lymphocytes Relative: 33.8 % (ref 12.0–46.0)
MCV: 85.8 fl (ref 78.0–100.0)
Monocytes Absolute: 0.5 10*3/uL (ref 0.1–1.0)
Monocytes Relative: 8.1 % (ref 3.0–12.0)
Neutrophils Relative %: 55.8 % (ref 43.0–77.0)
Platelets: 215 10*3/uL (ref 150.0–400.0)
RBC: 4.82 Mil/uL (ref 4.22–5.81)
WBC: 6.4 10*3/uL (ref 4.5–10.5)

## 2012-09-13 LAB — BASIC METABOLIC PANEL
BUN: 13 mg/dL (ref 6–23)
Chloride: 104 mEq/L (ref 96–112)
Creatinine, Ser: 1 mg/dL (ref 0.4–1.5)
GFR: 97.81 mL/min (ref >60.00)

## 2012-09-13 LAB — LIPID PANEL
Cholesterol: 162 mg/dL (ref 0–200)
LDL Cholesterol: 92 mg/dL (ref 0–99)
Total CHOL/HDL Ratio: 4
Triglycerides: 134 mg/dL (ref 0.0–149.0)
VLDL: 26.8 mg/dL (ref 0.0–40.0)

## 2012-09-13 LAB — TSH: TSH: 0.68 u[IU]/mL (ref 0.35–5.50)

## 2012-09-13 LAB — HEPATIC FUNCTION PANEL
ALT: 24 U/L (ref 0–53)
AST: 21 U/L (ref 0–37)
Alkaline Phosphatase: 70 U/L (ref 39–117)
Bilirubin, Direct: 0 mg/dL (ref 0.0–0.3)
Total Bilirubin: 0.7 mg/dL (ref 0.3–1.2)

## 2012-09-13 NOTE — Assessment & Plan Note (Signed)
Healthy Discussed working on fitness Discussed PSA---will defer to next year

## 2012-09-13 NOTE — Patient Instructions (Signed)
Please try sennakot-S, 1-2 tablets daily to prevent constipation. You could also try miralax-- 1 capful with water daily.

## 2012-09-13 NOTE — Progress Notes (Signed)
Subjective:    Patient ID: Alejandro Hoover, male    DOB: 03-Mar-1956, 56 y.o.   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 aspirin 81 MG tablet Take 81 mg by mouth daily.        Marland Kitchen FLUoxetine (PROZAC) 40 MG capsule Take 1 capsule (40 mg total) by mouth daily.  90 capsule  3  . omeprazole (PRILOSEC) 20 MG capsule Take 20 mg by mouth daily.        . simvastatin (ZOCOR) 40 MG tablet TAKE 1 TABLET BY MOUTH EVERY NIGHT AT BEDTIME  90 tablet  0  . traZODone (DESYREL) 100 MG tablet TAKE 1-2 TABLETS BY MOUTH AT BEDTIME TO HELP SLEEP AND MOOD  180 tablet  0    No Known Allergies  Past Medical History  Diagnosis Date  . Depression   . GERD (gastroesophageal reflux disease)   . Hyperlipidemia   . IBS (irritable bowel syndrome)   . Anxiety   . ED (erectile dysfunction)   . Hiatal hernia 09-28-2005  . Gastritis     mild     Past Surgical History  Procedure Date  . Cardiovascular stress test     neg  . Esophagogastroduodenoscopy     Family History  Problem Relation Age of Onset  . Colon cancer Neg Hx   . Diabetes Mother   . Heart disease Mother   . Liver disease      3 siblings  . Heart failure Daughter   . Heart failure Daughter     History   Social History  . Marital Status: Married    Spouse Name: N/A    Number of Children: 2  . Years of Education: N/A   Occupational History  . retired Research scientist (physical sciences)   . now does foster care    Social History Main Topics  . Smoking status: Former Smoker    Types: Cigarettes    Quit date: 09/25/1968  . Smokeless tobacco: Never Used  . Alcohol Use: Yes     Comment: 5-6 drinks a week   . Drug Use: No  . Sexually Active: Not on file    Other Topics Concern  . Not on file   Social History Narrative  . No narrative on file   Review of Systems  Constitutional: Negative for fatigue.       Wears seat belt  HENT: Negative for hearing loss, congestion, rhinorrhea, dental problem and tinnitus.        Regular with dentist Mild dry mouth symptoms---improved from the past  Eyes: Negative for visual disturbance.       No diplopia or unilateral vision loss  Respiratory: Negative for cough, chest tightness and shortness of breath.   Cardiovascular: Negative for chest pain, palpitations and leg swelling.  Gastrointestinal: Positive for constipation. Negative for nausea, vomiting, abdominal pain and blood in stool.       Uses metamucil at times Heartburn is controlled  Genitourinary: Negative for urgency, frequency and difficulty urinating.       Some nocturia  No sexual complaints  Musculoskeletal: Negative for back pain, joint swelling and arthralgias.  Skin: Negative for rash.       No  suspicious lesions  Neurological: Positive for numbness. Negative for dizziness, syncope, weakness, light-headedness and headaches.       Some itching or burning sensation on plantar feet---occurs once a month or so. Benedryl does help  Hematological: Negative for adenopathy. Bruises/bleeds easily.       Delayed wound and bruise healing  Psychiatric/Behavioral: Negative for sleep disturbance and dysphoric mood. The patient is not nervous/anxious.        Objective:   Physical Exam  Constitutional: He is oriented to person, place, and time. He appears well-developed and well-nourished. No distress.  HENT:  Head: Normocephalic and atraumatic.  Right Ear: External ear normal.  Left Ear: External ear normal.  Mouth/Throat: Oropharynx is clear and moist. No oropharyngeal exudate.  Eyes: Conjunctivae normal and EOM are normal. Pupils are equal, round, and reactive to light.  Neck: Normal range of motion. Neck supple. No thyromegaly present.   Cardiovascular: Normal rate, regular rhythm, normal heart sounds and intact distal pulses.  Exam reveals no gallop.   No murmur heard. Pulmonary/Chest: Effort normal and breath sounds normal. No respiratory distress. He has no wheezes. He has no rales.  Abdominal: Soft. There is no tenderness.  Musculoskeletal: He exhibits no edema and no tenderness.  Lymphadenopathy:    He has no cervical adenopathy.  Neurological: He is alert and oriented to person, place, and time.  Skin: No rash noted. No erythema.       Skin tags in axilla  Psychiatric: He has a normal mood and affect. His behavior is normal.          Assessment & Plan:

## 2012-09-13 NOTE — Assessment & Plan Note (Signed)
No problems with the med Due for labs 

## 2012-09-13 NOTE — Assessment & Plan Note (Signed)
Has been in remission for a long time Will continue the meds

## 2012-09-13 NOTE — Addendum Note (Signed)
Addended by: Sueanne Margarita on: 09/13/2012 09:27 AM   Modules accepted: Orders

## 2012-09-16 ENCOUNTER — Encounter: Payer: Self-pay | Admitting: *Deleted

## 2012-09-16 LAB — TB SKIN TEST: TB Skin Test: NEGATIVE

## 2012-09-24 ENCOUNTER — Encounter: Payer: No Typology Code available for payment source | Admitting: Internal Medicine

## 2012-11-05 ENCOUNTER — Other Ambulatory Visit: Payer: Self-pay | Admitting: Internal Medicine

## 2013-11-14 ENCOUNTER — Other Ambulatory Visit: Payer: Self-pay | Admitting: Internal Medicine

## 2013-11-14 NOTE — Telephone Encounter (Signed)
Okay to fill for 3 months Have him set up PE soon

## 2013-11-14 NOTE — Telephone Encounter (Signed)
Last seen 08/2012, ok to fill?

## 2014-01-01 ENCOUNTER — Other Ambulatory Visit: Payer: Self-pay | Admitting: Internal Medicine

## 2014-04-28 ENCOUNTER — Other Ambulatory Visit: Payer: Self-pay | Admitting: Internal Medicine

## 2014-05-22 ENCOUNTER — Other Ambulatory Visit: Payer: Self-pay | Admitting: *Deleted

## 2014-05-22 MED ORDER — SIMVASTATIN 40 MG PO TABS: 40.0000 mg | ORAL_TABLET | Freq: Every day | ORAL | Status: DC

## 2014-05-25 ENCOUNTER — Telehealth: Payer: Self-pay | Admitting: Internal Medicine

## 2014-05-25 NOTE — Telephone Encounter (Signed)
Rescheduled appt

## 2014-05-25 NOTE — Telephone Encounter (Signed)
Pt's wife is calling and is concerned that pt has not has his CPE since 2013. Pt is scheduled for his CPE 09/29/2014 and she is aware of this but wants him to have it sooner, but I explained to her that Dr Silvio Pate does not have anything sooner for a CPE. Wants to speak with [facility] regarding this. Please advise. Thank you

## 2014-07-10 ENCOUNTER — Encounter: Payer: Self-pay | Admitting: Internal Medicine

## 2014-07-10 ENCOUNTER — Ambulatory Visit (INDEPENDENT_AMBULATORY_CARE_PROVIDER_SITE_OTHER): Payer: No Typology Code available for payment source | Admitting: Internal Medicine

## 2014-07-10 VITALS — BP 140/84 | HR 74 | Temp 98.6°F | Resp 14 | Ht 72.0 in | Wt 219.2 lb

## 2014-07-10 DIAGNOSIS — E785 Hyperlipidemia, unspecified: Secondary | ICD-10-CM

## 2014-07-10 DIAGNOSIS — Z23 Encounter for immunization: Secondary | ICD-10-CM

## 2014-07-10 DIAGNOSIS — Z125 Encounter for screening for malignant neoplasm of prostate: Secondary | ICD-10-CM

## 2014-07-10 DIAGNOSIS — Z0000 Encounter for general adult medical examination without abnormal findings: Secondary | ICD-10-CM

## 2014-07-10 DIAGNOSIS — K219 Gastro-esophageal reflux disease without esophagitis: Secondary | ICD-10-CM

## 2014-07-10 DIAGNOSIS — F419 Anxiety disorder, unspecified: Secondary | ICD-10-CM

## 2014-07-10 LAB — COMPREHENSIVE METABOLIC PANEL
ALK PHOS: 80 U/L (ref 39–117)
ALT: 27 U/L (ref 0–53)
AST: 27 U/L (ref 0–37)
Albumin: 3.8 g/dL (ref 3.5–5.2)
BILIRUBIN TOTAL: 0.9 mg/dL (ref 0.2–1.2)
BUN: 9 mg/dL (ref 6–23)
CO2: 27 mEq/L (ref 19–32)
Calcium: 9.4 mg/dL (ref 8.4–10.5)
Chloride: 105 mEq/L (ref 96–112)
Creatinine, Ser: 0.8 mg/dL (ref 0.4–1.5)
GFR: 123.62 mL/min (ref >60.00)
Glucose, Bld: 116 mg/dL — ABNORMAL HIGH (ref 70–99)
Potassium: 4.2 mEq/L (ref 3.5–5.1)
SODIUM: 139 meq/L (ref 135–145)
TOTAL PROTEIN: 8 g/dL (ref 6.0–8.3)

## 2014-07-10 LAB — CBC WITH DIFFERENTIAL/PLATELET
Basophils Absolute: 0 10*3/uL (ref 0.0–0.1)
Basophils Relative: 0.4 % (ref 0.0–3.0)
EOS PCT: 2.5 % (ref 0.0–5.0)
Eosinophils Absolute: 0.2 10*3/uL (ref 0.0–0.7)
HEMATOCRIT: 44.7 % (ref 39.0–52.0)
HEMOGLOBIN: 14.5 g/dL (ref 13.0–17.0)
LYMPHS ABS: 2.4 10*3/uL (ref 0.7–4.0)
Lymphocytes Relative: 34.8 % (ref 12.0–46.0)
MCHC: 32.4 g/dL (ref 30.0–36.0)
MCV: 86 fl (ref 78.0–100.0)
MONO ABS: 0.6 10*3/uL (ref 0.1–1.0)
Monocytes Relative: 8.1 % (ref 3.0–12.0)
NEUTROS ABS: 3.7 10*3/uL (ref 1.4–7.7)
Neutrophils Relative %: 54.2 % (ref 43.0–77.0)
PLATELETS: 217 10*3/uL (ref 150.0–400.0)
RBC: 5.2 Mil/uL (ref 4.22–5.81)
RDW: 13.4 % (ref 11.5–15.5)
WBC: 6.9 10*3/uL (ref 4.0–10.5)

## 2014-07-10 LAB — LIPID PANEL
Cholesterol: 194 mg/dL (ref 0–200)
HDL: 40.3 mg/dL (ref >39.00)
NONHDL: 153.7
TRIGLYCERIDES: 212 mg/dL — AB (ref 0.0–149.0)
Total CHOL/HDL Ratio: 5
VLDL: 42.4 mg/dL — ABNORMAL HIGH (ref 0.0–40.0)

## 2014-07-10 LAB — LDL CHOLESTEROL, DIRECT: LDL DIRECT: 120.7 mg/dL

## 2014-07-10 MED ORDER — SIMVASTATIN 40 MG PO TABS: 40.0000 mg | ORAL_TABLET | Freq: Every day | ORAL | Status: DC

## 2014-07-10 MED ORDER — OMEPRAZOLE 20 MG PO CPDR: 20.0000 mg | DELAYED_RELEASE_CAPSULE | Freq: Every day | ORAL | Status: DC

## 2014-07-10 NOTE — Assessment & Plan Note (Signed)
Healthy but needs to work on fitness Will check PSA after discussion Due for colonoscopy 1/17--will push up if blood in stool (he will let me know) Liver disease in family--he will try to get info

## 2014-07-10 NOTE — Progress Notes (Signed)
Subjective:    Patient ID: Alejandro Hoover, male    DOB: Aug 19, 1956, 58 y.o.   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 omeprazole (PRILOSEC) 20 MG capsule Take 20 mg by mouth daily.        . simvastatin (ZOCOR) 40 MG tablet Take 1 tablet (40 mg total) by mouth daily.  90 tablet  0   No current facility-administered medications on file prior to visit.    No Known Allergies  Past Medical History  Diagnosis Date  . Depression   . GERD (gastroesophageal reflux disease)   . Hyperlipidemia   . IBS (irritable bowel syndrome)   . Anxiety   . ED (erectile dysfunction)   . Hiatal hernia 09-28-2005  . Gastritis     mild     Past Surgical History  Procedure Laterality Date  . Cardiovascular stress test      neg  . Esophagogastroduodenoscopy      Family History  Problem Relation Age of Onset  . Colon cancer Neg Hx   . Diabetes Mother   . Heart disease Mother   . Liver disease      3 siblings  . Heart failure Daughter   . Heart failure Daughter     History   Social History  . Marital Status: Married    Spouse Name: N/A    Number of Children: 2  . Years of Education: N/A   Occupational History  . Retired Scientist, research (medical)   . Now does foster care    Social History Main Topics  . Smoking status: Former Smoker    Types: Cigarettes    Quit date: 09/25/1968  . Smokeless tobacco: Never Used  . Alcohol Use: Yes     Comment: 5-6 drinks a week   . Drug Use: No  . Sexual Activity: Not on file   Other Topics Concern  . Not on file   Social History Narrative  . No  narrative on file   Review of Systems  Constitutional: Negative for fatigue and unexpected weight change.       Wears seat belt  HENT: Negative for dental problem, hearing loss, tinnitus and trouble swallowing.        Has bad breath--teeth okay  Eyes: Negative for visual disturbance.       No diplopia or unilateral vision loss  Cardiovascular: Positive for palpitations. Negative for chest pain.       One anxiety spell last week with palpitations  Gastrointestinal: Positive for blood in stool. Negative for nausea, vomiting, abdominal pain and constipation.       Heartburn controlled on prilosec Rare blood in stool--same as in past (once or twice a year)  Endocrine: Negative for polydipsia and polyuria.  Genitourinary: Positive for frequency. Negative for urgency and difficulty urinating.       Nocturia x 2 No sexual problems  Musculoskeletal: Positive for arthralgias. Negative for back pain and joint swelling.       Left shoulder pain at times--no meds needed  Skin: Negative for rash.       No  suspicious lesions  Allergic/Immunologic: Negative for environmental allergies and immunocompromised state.  Neurological: Negative for dizziness, syncope, weakness, light-headedness and headaches.       Gets itching in his feet and left hand and arm will have shooting pains at times (better with change in sleep position)  Hematological: Negative for adenopathy. Bruises/bleeds easily.  Psychiatric/Behavioral: Negative for sleep disturbance and dysphoric mood. The patient is nervous/anxious.        Rare anxiety-- self medicating with marijuana (once in a while)       Objective:   Physical Exam  Constitutional: He is oriented to person, place, and time. He appears well-developed and well-nourished. No distress.  HENT:  Head: Normocephalic and atraumatic.  Right Ear: External ear normal.  Left Ear: External ear normal.  Mouth/Throat: Oropharynx is clear and moist. No oropharyngeal exudate.    Eyes: Conjunctivae and EOM are normal. Pupils are equal, round, and reactive to light.  Neck: Normal range of motion. Neck supple. No thyromegaly present.  Cardiovascular: Normal rate, regular rhythm, normal heart sounds and intact distal pulses.  Exam reveals no gallop.   No murmur heard. Pulmonary/Chest: Effort normal and breath sounds normal. No respiratory distress. He has no wheezes. He has no rales.  Abdominal: Soft. There is no tenderness.  Musculoskeletal: He exhibits no edema and no tenderness.  Lymphadenopathy:    He has no cervical adenopathy.  Neurological: He is alert and oriented to person, place, and time.  Skin: No rash noted. No erythema.  Psychiatric: He has a normal mood and affect. His behavior is normal.          Assessment & Plan:

## 2014-07-10 NOTE — Progress Notes (Signed)
Pre visit review using our clinic review tool, if applicable. No additional management support is needed unless otherwise documented below in the visit note. 

## 2014-07-10 NOTE — Assessment & Plan Note (Signed)
Controlled with PPI

## 2014-07-10 NOTE — Assessment & Plan Note (Signed)
Mild and intermittent Okay off the fluoxetine

## 2014-07-10 NOTE — Assessment & Plan Note (Signed)
Okay with primary prevention Due for labs

## 2014-07-13 LAB — T4, FREE: FREE T4: 0.88 ng/dL (ref 0.60–1.60)

## 2014-07-13 LAB — PSA: PSA: 0.61 ng/mL (ref 0.10–4.00)

## 2014-07-15 ENCOUNTER — Encounter: Payer: Self-pay | Admitting: *Deleted

## 2014-09-29 ENCOUNTER — Encounter: Payer: No Typology Code available for payment source | Admitting: Internal Medicine

## 2015-06-15 ENCOUNTER — Other Ambulatory Visit: Payer: Self-pay | Admitting: Internal Medicine

## 2015-06-18 ENCOUNTER — Telehealth: Payer: Self-pay

## 2015-06-18 NOTE — Telephone Encounter (Signed)
Pt request refill omeprazole to [facility] [address]. Spoke with sybil at [facility] and pt still has refill available and will get ready for pick up. Pt voiced understanding. Pt has CPX on 07/20/15.

## 2015-07-13 ENCOUNTER — Encounter: Payer: No Typology Code available for payment source | Admitting: Internal Medicine

## 2015-07-20 ENCOUNTER — Encounter: Payer: Self-pay | Admitting: Internal Medicine

## 2015-07-20 ENCOUNTER — Ambulatory Visit (INDEPENDENT_AMBULATORY_CARE_PROVIDER_SITE_OTHER): Payer: No Typology Code available for payment source | Admitting: Internal Medicine

## 2015-07-20 VITALS — BP 140/90 | HR 75 | Temp 98.2°F | Ht 72.0 in | Wt 226.0 lb

## 2015-07-20 DIAGNOSIS — R7301 Impaired fasting glucose: Secondary | ICD-10-CM | POA: Diagnosis not present

## 2015-07-20 DIAGNOSIS — Z0000 Encounter for general adult medical examination without abnormal findings: Secondary | ICD-10-CM

## 2015-07-20 DIAGNOSIS — E785 Hyperlipidemia, unspecified: Secondary | ICD-10-CM

## 2015-07-20 DIAGNOSIS — Z1211 Encounter for screening for malignant neoplasm of colon: Secondary | ICD-10-CM

## 2015-07-20 DIAGNOSIS — Z23 Encounter for immunization: Secondary | ICD-10-CM

## 2015-07-20 DIAGNOSIS — R7303 Prediabetes: Secondary | ICD-10-CM | POA: Insufficient documentation

## 2015-07-20 DIAGNOSIS — K219 Gastro-esophageal reflux disease without esophagitis: Secondary | ICD-10-CM

## 2015-07-20 LAB — CBC WITH DIFFERENTIAL/PLATELET
BASOS PCT: 0.7 % (ref 0.0–3.0)
Basophils Absolute: 0.1 10*3/uL (ref 0.0–0.1)
EOS PCT: 1.9 % (ref 0.0–5.0)
Eosinophils Absolute: 0.2 10*3/uL (ref 0.0–0.7)
HCT: 45.3 % (ref 39.0–52.0)
HEMOGLOBIN: 14.7 g/dL (ref 13.0–17.0)
LYMPHS PCT: 36.1 % (ref 12.0–46.0)
Lymphs Abs: 3.4 10*3/uL (ref 0.7–4.0)
MCHC: 32.5 g/dL (ref 30.0–36.0)
MCV: 85.3 fl (ref 78.0–100.0)
MONO ABS: 0.7 10*3/uL (ref 0.1–1.0)
Monocytes Relative: 6.8 % (ref 3.0–12.0)
Neutro Abs: 5.2 10*3/uL (ref 1.4–7.7)
Neutrophils Relative %: 54.5 % (ref 43.0–77.0)
Platelets: 223 10*3/uL (ref 150.0–400.0)
RBC: 5.31 Mil/uL (ref 4.22–5.81)
RDW: 13.6 % (ref 11.5–15.5)
WBC: 9.5 10*3/uL (ref 4.0–10.5)

## 2015-07-20 LAB — COMPREHENSIVE METABOLIC PANEL
ALBUMIN: 4.5 g/dL (ref 3.5–5.2)
ALK PHOS: 82 U/L (ref 39–117)
ALT: 25 U/L (ref 0–53)
AST: 21 U/L (ref 0–37)
BUN: 10 mg/dL (ref 6–23)
CO2: 27 mEq/L (ref 19–32)
Calcium: 9.4 mg/dL (ref 8.4–10.5)
Chloride: 103 mEq/L (ref 96–112)
Creatinine, Ser: 0.85 mg/dL (ref 0.40–1.50)
GFR: 118.18 mL/min (ref >60.00)
Glucose, Bld: 92 mg/dL (ref 70–99)
POTASSIUM: 3.7 meq/L (ref 3.5–5.1)
Sodium: 139 mEq/L (ref 135–145)
Total Bilirubin: 0.6 mg/dL (ref 0.2–1.2)
Total Protein: 7.9 g/dL (ref 6.0–8.3)

## 2015-07-20 LAB — LIPID PANEL
Cholesterol: 194 mg/dL (ref 0–200)
HDL: 49.5 mg/dL (ref >39.00)
LDL CALC: 109 mg/dL — AB (ref 0–99)
NonHDL: 144.62
Total CHOL/HDL Ratio: 4
Triglycerides: 180 mg/dL — ABNORMAL HIGH (ref 0.0–149.0)
VLDL: 36 mg/dL (ref 0.0–40.0)

## 2015-07-20 LAB — T4, FREE: FREE T4: 0.68 ng/dL (ref 0.60–1.60)

## 2015-07-20 LAB — HEMOGLOBIN A1C: Hgb A1c MFr Bld: 6.7 % — ABNORMAL HIGH (ref 4.6–6.5)

## 2015-07-20 MED ORDER — ZOSTER VACCINE LIVE 19400 UNT/0.65ML ~~LOC~~ SOLR: 0.6500 mL | Freq: Once | SUBCUTANEOUS | Status: DC

## 2015-07-20 NOTE — Progress Notes (Signed)
Subjective:    Patient ID: Alejandro Hoover, male    DOB: 01/28/1956, 59 y.o.   MRN: [id number]  HPI Here for physical  Has no information about vague history of liver problems in family Feels well  Has exercise equipment but not using it Weight up a few pounds  No problems with statin No myalgias or GI problems  Takes the PPI regularly This controls heartburn No dysphagia  No foster children now Step daughter and her children have moved  Current Outpatient Prescriptions on File Prior to Visit  Medication Sig Dispense Refill  . omeprazole (PRILOSEC) 20 MG capsule Take 1 capsule (20 mg total) by mouth daily. 90 capsule 3  . simvastatin (ZOCOR) 40 MG tablet TAKE 1 TABLET BY MOUTH ONCE DAILY 90 tablet 0   No current facility-administered medications on file prior to visit.    No Known Allergies  Past Medical History  Diagnosis Date  . Depression   . Hyperlipidemia   . IBS (irritable bowel syndrome)   . Anxiety   . ED (erectile dysfunction)   . Hiatal hernia 09-28-2005  . Gastritis     mild   . GERD (gastroesophageal reflux disease)     Past Surgical History  Procedure Laterality Date  . Cardiovascular stress test      neg  . Esophagogastroduodenoscopy      Family History  Problem Relation Age of Onset  . Colon cancer Neg Hx   . Diabetes Mother   . Heart disease Mother   . Liver disease      3 siblings  . Heart failure Daughter   . Heart failure Daughter     Social History   Social History  . Marital Status: Married    Spouse Name: N/A  . Number of Children: 2  . Years of Education: N/A   Occupational History  . Retired Scientist, research (medical)   . Now does foster care    Social History Main Topics  . Smoking status: Former Smoker    Types: Cigarettes    Quit date: 09/25/1968  . Smokeless tobacco: Never Used  . Alcohol Use: Yes     Comment: 5-6 drinks a week   . Drug Use: No  . Sexual Activity: Not on file   Other Topics Concern  . Not on file    Social History Narrative   Review of Systems  Constitutional: Positive for unexpected weight change. Negative for fatigue.       Wears seat belt  HENT: Negative for dental problem, hearing loss and sore throat.        Some dry mouth Keeps up with dentist   Eyes: Negative for visual disturbance.       No diplopia or unilateral vision loss  Respiratory: Positive for cough. Negative for chest tightness.        Cough with phlegm in past 2 months  Cardiovascular: Negative for chest pain, palpitations and leg swelling.  Gastrointestinal: Negative for nausea, vomiting, abdominal pain, constipation and blood in stool.  Endocrine: Negative for polydipsia and polyuria.  Genitourinary: Negative for difficulty urinating.       No sexual problems  Musculoskeletal: Negative for back pain, joint swelling and arthralgias.  Skin: Negative for rash.       No suspicious lesions  Allergic/Immunologic: Negative for immunocompromised state.       Some phlegm in throat-- has tried decongestant briefly  Neurological: Negative for dizziness, syncope, weakness, light-headedness and headaches.  Hematological: Negative for adenopathy.  Bruises/bleeds easily.  Psychiatric/Behavioral: Negative for dysphoric mood. The patient is not nervous/anxious.        Chronic sleep issues--not working so not really a problem. Often only 5 hours per night.       Objective:   Physical Exam  Constitutional: He is oriented to person, place, and time. He appears well-developed and well-nourished. No distress.  HENT:  Head: Normocephalic and atraumatic.  Right Ear: External ear normal.  Left Ear: External ear normal.  Mouth/Throat: Oropharynx is clear and moist. No oropharyngeal exudate.  Eyes: Conjunctivae and EOM are normal. Pupils are equal, round, and reactive to light.  Neck: Normal range of motion. Neck supple. No thyromegaly present.  Cardiovascular: Normal rate, regular rhythm, normal heart sounds and intact  distal pulses.  Exam reveals no gallop.   No murmur heard. Pulmonary/Chest: Effort normal and breath sounds normal. No respiratory distress. He has no wheezes. He has no rales.  Abdominal: Soft. There is no tenderness.  Musculoskeletal: He exhibits no edema.  Lymphadenopathy:    He has no cervical adenopathy.  Neurological: He is alert and oriented to person, place, and time.  Skin: No rash noted. No erythema.  Psychiatric: He has a normal mood and affect. His behavior is normal.          Assessment & Plan:

## 2015-07-20 NOTE — Assessment & Plan Note (Signed)
Controlled with PPI

## 2015-07-20 NOTE — Patient Instructions (Addendum)
Please try loratadine 10mg  1-2 daily or cetirizine 10mg  daily for the phlegm.  DASH Eating Plan DASH stands for "Dietary Approaches to Stop Hypertension." The DASH eating plan is a healthy eating plan that has been shown to reduce high blood pressure (hypertension). Additional health benefits may include reducing the risk of type 2 diabetes mellitus, heart disease, and stroke. The DASH eating plan may also help with weight loss. WHAT DO I NEED TO KNOW ABOUT THE DASH EATING PLAN? For the DASH eating plan, you will follow these general guidelines:  Choose foods with a percent daily value for sodium of less than 5% (as listed on the food label).  Use salt-free seasonings or herbs instead of table salt or sea salt.  Check with your health care provider or pharmacist before using salt substitutes.  Eat lower-sodium products, often labeled as "lower sodium" or "no salt added."  Eat fresh foods.  Eat more vegetables, fruits, and low-fat dairy products.  Choose whole grains. Look for the word "whole" as the first word in the ingredient list.  Choose fish and skinless chicken or [place] more often than red meat. Limit fish, poultry, and meat to 6 oz (170 g) each day.  Limit sweets, desserts, sugars, and sugary drinks.  Choose heart-healthy fats.  Limit cheese to 1 oz (28 g) per day.  Eat more home-cooked food and less restaurant, buffet, and fast food.  Limit fried foods.  Cook foods using methods other than frying.  Limit canned vegetables. If you do use them, rinse them well to decrease the sodium.  When eating at a restaurant, ask that your food be prepared with less salt, or no salt if possible. WHAT FOODS CAN I EAT? Seek help from a dietitian for individual calorie needs. Grains Whole grain or whole wheat bread. Brown rice. Whole grain or whole wheat pasta. Quinoa, bulgur, and whole grain cereals. Low-sodium cereals. Corn or whole wheat flour tortillas. Whole grain cornbread.  Whole grain crackers. Low-sodium crackers. Vegetables Fresh or frozen vegetables (raw, steamed, roasted, or grilled). Low-sodium or reduced-sodium tomato and vegetable juices. Low-sodium or reduced-sodium tomato sauce and paste. Low-sodium or reduced-sodium canned vegetables.  Fruits All fresh, canned (in natural juice), or frozen fruits. Meat and Other Protein Products Ground beef (85% or leaner), grass-fed beef, or beef trimmed of fat. Skinless chicken or [place]. Ground chicken or [place]. Pork trimmed of fat. All fish and seafood. Eggs. Dried beans, peas, or lentils. Unsalted nuts and seeds. Unsalted canned beans. Dairy Low-fat dairy products, such as skim or 1% milk, 2% or reduced-fat cheeses, low-fat ricotta or cottage cheese, or plain low-fat yogurt. Low-sodium or reduced-sodium cheeses. Fats and Oils Tub margarines without trans fats. Light or reduced-fat mayonnaise and salad dressings (reduced sodium). Avocado. Safflower, olive, or canola oils. Natural peanut or almond butter. Other Unsalted popcorn and pretzels. The items listed above may not be a complete list of recommended foods or beverages. Contact your dietitian for more options. WHAT FOODS ARE NOT RECOMMENDED? Grains White bread. White pasta. White rice. Refined cornbread. Bagels and croissants. Crackers that contain trans fat. Vegetables Creamed or fried vegetables. Vegetables in a cheese sauce. Regular canned vegetables. Regular canned tomato sauce and paste. Regular tomato and vegetable juices. Fruits Dried fruits. Canned fruit in light or heavy syrup. Fruit juice. Meat and Other Protein Products Fatty cuts of meat. Ribs, chicken wings, bacon, sausage, bologna, salami, chitterlings, fatback, hot dogs, bratwurst, and packaged luncheon meats. Salted nuts and seeds. Canned beans with salt. Dairy  Whole or 2% milk, cream, half-and-half, and cream cheese. Whole-fat or sweetened yogurt. Full-fat cheeses or blue cheese. Nondairy  creamers and whipped toppings. Processed cheese, cheese spreads, or cheese curds. Condiments Onion and garlic salt, seasoned salt, table salt, and sea salt. Canned and packaged gravies. Worcestershire sauce. Tartar sauce. Barbecue sauce. Teriyaki sauce. Soy sauce, including reduced sodium. Steak sauce. Fish sauce. Oyster sauce. Cocktail sauce. Horseradish. Ketchup and mustard. Meat flavorings and tenderizers. Bouillon cubes. Hot sauce. Tabasco sauce. Marinades. Taco seasonings. Relishes. Fats and Oils Butter, stick margarine, lard, shortening, ghee, and bacon fat. Coconut, palm kernel, or palm oils. Regular salad dressings. Other Pickles and olives. Salted popcorn and pretzels. The items listed above may not be a complete list of foods and beverages to avoid. Contact your dietitian for more information. WHERE CAN I FIND MORE INFORMATION? National Heart, Lung, and Blood Institute: travelstabloid.com   This information is not intended to replace advice given to you by your health care provider. Make sure you discuss any questions you have with your health care provider.   Document Released: 08/31/2011 Document Revised: 10/02/2014 Document Reviewed: 07/16/2013 Elsevier Interactive Patient Education [employer].

## 2015-07-20 NOTE — Assessment & Plan Note (Signed)
No problems with statin 

## 2015-07-20 NOTE — Assessment & Plan Note (Signed)
Healthy but really needs to work on fitness Flu vaccine today Rx for zostavax Defer PSA to at least next year Due for colon

## 2015-07-20 NOTE — Assessment & Plan Note (Signed)
Discussed lifestyle Will send for counseling in [place] if up in diabetic range

## 2015-07-20 NOTE — Addendum Note (Signed)
Addended by: Despina Hidden on: 07/20/2015 01:11 PM   Modules accepted: Orders

## 2015-07-20 NOTE — Progress Notes (Signed)
Pre visit review using our clinic review tool, if applicable. No additional management support is needed unless otherwise documented below in the visit note. 

## 2015-07-21 ENCOUNTER — Encounter: Payer: Self-pay | Admitting: *Deleted

## 2015-08-02 ENCOUNTER — Other Ambulatory Visit: Payer: Self-pay | Admitting: Internal Medicine

## 2015-09-16 ENCOUNTER — Other Ambulatory Visit: Payer: Self-pay | Admitting: Internal Medicine

## 2016-01-16 ENCOUNTER — Other Ambulatory Visit: Payer: Self-pay | Admitting: Internal Medicine

## 2016-07-26 ENCOUNTER — Encounter: Payer: Self-pay | Admitting: Family Medicine

## 2016-07-26 ENCOUNTER — Encounter: Payer: No Typology Code available for payment source | Admitting: Internal Medicine

## 2016-07-26 ENCOUNTER — Encounter: Payer: No Typology Code available for payment source | Admitting: Primary Care

## 2016-07-26 ENCOUNTER — Ambulatory Visit (INDEPENDENT_AMBULATORY_CARE_PROVIDER_SITE_OTHER): Payer: No Typology Code available for payment source | Admitting: Family Medicine

## 2016-07-26 VITALS — BP 138/80 | HR 76 | Temp 98.5°F | Ht 72.0 in | Wt 223.5 lb

## 2016-07-26 DIAGNOSIS — Z119 Encounter for screening for infectious and parasitic diseases, unspecified: Secondary | ICD-10-CM

## 2016-07-26 DIAGNOSIS — Z23 Encounter for immunization: Secondary | ICD-10-CM | POA: Diagnosis not present

## 2016-07-26 DIAGNOSIS — K219 Gastro-esophageal reflux disease without esophagitis: Secondary | ICD-10-CM

## 2016-07-26 DIAGNOSIS — Z0000 Encounter for general adult medical examination without abnormal findings: Secondary | ICD-10-CM

## 2016-07-26 DIAGNOSIS — E785 Hyperlipidemia, unspecified: Secondary | ICD-10-CM

## 2016-07-26 MED ORDER — OMEPRAZOLE 20 MG PO CPDR: 20.0000 mg | DELAYED_RELEASE_CAPSULE | Freq: Every day | ORAL | 3 refills | Status: DC

## 2016-07-26 MED ORDER — SIMVASTATIN 40 MG PO TABS: 40.0000 mg | ORAL_TABLET | Freq: Every day | ORAL | 3 refills | Status: DC

## 2016-07-26 NOTE — Progress Notes (Signed)
CPE- See plan.  Routine anticipatory guidance given to patient.  See health maintenance. Tetanus 2017 Flu 2017 PNA due at 64 Shingles d/w pt.  Colonoscopy 2009 Prostate cancer screening and PSA options (with potential risks and benefits of testing vs not testing) were discussed along with recent recs/guidelines.  He declined testing PSA at this point. Living will d/w pt. Wife designated if patient were incapacitated.   Diet and exercise d/w pt.  Encouraged both.  Wife is getting back to exercising and he is going to join her.  Pt opts in for HCV screening.  D/w pt re: routine screening.   HIV screening declined.  D/w pt.    Elevated Cholesterol: Using medications without problems:yes Muscle aches: no Diet compliance:encouarged.  Exercise: encouraged  GERD.  Controlled with PPI.  No ADE on med.  He notes sx when missing his medicine.  No blood in stool.    PMH and SH reviewed  Meds, vitals, and allergies reviewed.   ROS: Per HPI.  Unless specifically indicated otherwise in HPI, the patient denies:  General: fever. Eyes: acute vision changes ENT: sore throat Cardiovascular: chest pain Respiratory: SOB GI: vomiting GU: dysuria Musculoskeletal: acute back pain Derm: acute rash Neuro: acute motor dysfunction Psych: worsening mood Endocrine: polydipsia Heme: bleeding Allergy: hayfever  GEN: nad, alert and oriented HEENT: mucous membranes moist NECK: supple w/o LA CV: rrr. PULM: ctab, no inc wob ABD: soft, +bs EXT: no edema SKIN: no acute rash

## 2016-07-26 NOTE — Patient Instructions (Addendum)
Check with your insurance to see if they will cover the shingles shot. Get a fasting lab appointment.  Take care.  Glad to see you.  Check to make sure the foster form was sent in.  I'll check here in the meantime. I can work on it when I get it.

## 2016-07-26 NOTE — Progress Notes (Signed)
Pre visit review using our clinic review tool, if applicable. No additional management support is needed unless otherwise documented below in the visit note. 

## 2016-07-27 NOTE — Assessment & Plan Note (Addendum)
Tetanus 2017 Flu 2017 PNA due at 64 Shingles d/w pt.  Colonoscopy 2009 Prostate cancer screening and PSA options (with potential risks and benefits of testing vs not testing) were discussed along with recent recs/guidelines.  He declined testing PSA at this point. Living will d/w pt. Wife designated if patient were incapacitated.   Diet and exercise d/w pt.  Encouraged both.  Wife is getting back to exercising and he is going to join her.  Pt opts in for HCV screening.  D/w pt re: routine screening.   HIV screening declined.  D/w pt.   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  He thought his wife had already some the sent the form. He will check on that. I can fill out the form. There is no contraindication to him being a foster parent.

## 2016-07-27 NOTE — Assessment & Plan Note (Signed)
Continue current medication. No adverse effect. Discussed with patient about diet and exercise.

## 2016-07-27 NOTE — Assessment & Plan Note (Signed)
Continue current medication. No adverse effect. Discussed with patient about diet and exercise. Return for labs.

## 2016-07-28 ENCOUNTER — Other Ambulatory Visit (INDEPENDENT_AMBULATORY_CARE_PROVIDER_SITE_OTHER): Payer: No Typology Code available for payment source

## 2016-07-28 DIAGNOSIS — Z119 Encounter for screening for infectious and parasitic diseases, unspecified: Secondary | ICD-10-CM

## 2016-07-28 DIAGNOSIS — E785 Hyperlipidemia, unspecified: Secondary | ICD-10-CM | POA: Diagnosis not present

## 2016-07-28 LAB — COMPREHENSIVE METABOLIC PANEL
ALBUMIN: 4.5 g/dL (ref 3.5–5.2)
ALT: 23 U/L (ref 0–53)
AST: 21 U/L (ref 0–37)
Alkaline Phosphatase: 80 U/L (ref 39–117)
BUN: 9 mg/dL (ref 6–23)
CHLORIDE: 104 meq/L (ref 96–112)
CO2: 29 meq/L (ref 19–32)
CREATININE: 0.93 mg/dL (ref 0.40–1.50)
Calcium: 9.4 mg/dL (ref 8.4–10.5)
GFR: 106.17 mL/min (ref >60.00)
GLUCOSE: 124 mg/dL — AB (ref 70–99)
Potassium: 4.2 mEq/L (ref 3.5–5.1)
SODIUM: 140 meq/L (ref 135–145)
Total Bilirubin: 0.6 mg/dL (ref 0.2–1.2)
Total Protein: 7.9 g/dL (ref 6.0–8.3)

## 2016-07-28 LAB — LIPID PANEL
CHOL/HDL RATIO: 3
Cholesterol: 151 mg/dL (ref 0–200)
HDL: 51.3 mg/dL (ref >39.00)
LDL CALC: 75 mg/dL (ref 0–99)
NonHDL: 99.77
Triglycerides: 124 mg/dL (ref 0.0–149.0)
VLDL: 24.8 mg/dL (ref 0.0–40.0)

## 2016-07-29 LAB — HEPATITIS C ANTIBODY: HCV Ab: NEGATIVE

## 2016-08-04 ENCOUNTER — Other Ambulatory Visit: Payer: No Typology Code available for payment source

## 2016-08-11 ENCOUNTER — Other Ambulatory Visit: Payer: Self-pay | Admitting: Internal Medicine

## 2016-08-14 MED ORDER — SIMVASTATIN 40 MG PO TABS
40.0000 mg | ORAL_TABLET | Freq: Every day | ORAL | 3 refills | Status: DC
Start: 2016-08-14 — End: 2017-08-28

## 2016-08-14 NOTE — Addendum Note (Signed)
Addended by: Pilar Grammes on: 08/14/2016 08:53 AM   Modules accepted: Orders

## 2016-12-27 ENCOUNTER — Other Ambulatory Visit: Payer: Self-pay | Admitting: *Deleted

## 2016-12-27 MED ORDER — OMEPRAZOLE 20 MG PO CPDR: 20.0000 mg | DELAYED_RELEASE_CAPSULE | Freq: Every day | ORAL | 1 refills | Status: DC

## 2017-06-29 ENCOUNTER — Other Ambulatory Visit: Payer: Self-pay | Admitting: Family Medicine

## 2017-08-28 ENCOUNTER — Ambulatory Visit (INDEPENDENT_AMBULATORY_CARE_PROVIDER_SITE_OTHER): Payer: 59 | Admitting: Internal Medicine

## 2017-08-28 ENCOUNTER — Encounter: Payer: Self-pay | Admitting: Internal Medicine

## 2017-08-28 VITALS — BP 138/80 | HR 60 | Temp 98.1°F | Ht 72.5 in | Wt 225.0 lb

## 2017-08-28 DIAGNOSIS — R200 Anesthesia of skin: Secondary | ICD-10-CM | POA: Diagnosis not present

## 2017-08-28 DIAGNOSIS — K219 Gastro-esophageal reflux disease without esophagitis: Secondary | ICD-10-CM

## 2017-08-28 DIAGNOSIS — Z1211 Encounter for screening for malignant neoplasm of colon: Secondary | ICD-10-CM | POA: Diagnosis not present

## 2017-08-28 DIAGNOSIS — Z0000 Encounter for general adult medical examination without abnormal findings: Secondary | ICD-10-CM

## 2017-08-28 DIAGNOSIS — R7301 Impaired fasting glucose: Secondary | ICD-10-CM | POA: Diagnosis not present

## 2017-08-28 DIAGNOSIS — Z23 Encounter for immunization: Secondary | ICD-10-CM

## 2017-08-28 DIAGNOSIS — E785 Hyperlipidemia, unspecified: Secondary | ICD-10-CM

## 2017-08-28 LAB — COMPREHENSIVE METABOLIC PANEL
ALK PHOS: 76 U/L (ref 39–117)
ALT: 26 U/L (ref 0–53)
AST: 23 U/L (ref 0–37)
Albumin: 4.7 g/dL (ref 3.5–5.2)
BILIRUBIN TOTAL: 0.8 mg/dL (ref 0.2–1.2)
BUN: 8 mg/dL (ref 6–23)
CALCIUM: 8.9 mg/dL (ref 8.4–10.5)
CO2: 25 meq/L (ref 19–32)
Chloride: 104 mEq/L (ref 96–112)
Creatinine, Ser: 0.81 mg/dL (ref 0.40–1.50)
GFR: 124.07 mL/min (ref >60.00)
GLUCOSE: 109 mg/dL — AB (ref 70–99)
POTASSIUM: 3.7 meq/L (ref 3.5–5.1)
Sodium: 137 mEq/L (ref 135–145)
Total Protein: 8.1 g/dL (ref 6.0–8.3)

## 2017-08-28 LAB — CBC
HEMATOCRIT: 47 % (ref 39.0–52.0)
HEMOGLOBIN: 15.4 g/dL (ref 13.0–17.0)
MCHC: 32.8 g/dL (ref 30.0–36.0)
MCV: 86.4 fl (ref 78.0–100.0)
PLATELETS: 233 10*3/uL (ref 150.0–400.0)
RBC: 5.44 Mil/uL (ref 4.22–5.81)
RDW: 13.6 % (ref 11.5–15.5)
WBC: 7.1 10*3/uL (ref 4.0–10.5)

## 2017-08-28 LAB — T4, FREE: Free T4: 0.72 ng/dL (ref 0.60–1.60)

## 2017-08-28 LAB — VITAMIN B12: VITAMIN B 12: 291 pg/mL (ref 211–911)

## 2017-08-28 LAB — LIPID PANEL
CHOLESTEROL: 162 mg/dL (ref 0–200)
HDL: 48.5 mg/dL (ref >39.00)
LDL Cholesterol: 83 mg/dL (ref 0–99)
NonHDL: 113.24
TRIGLYCERIDES: 153 mg/dL — AB (ref 0.0–149.0)
Total CHOL/HDL Ratio: 3
VLDL: 30.6 mg/dL (ref 0.0–40.0)

## 2017-08-28 LAB — HEMOGLOBIN A1C: Hgb A1c MFr Bld: 6.9 % — ABNORMAL HIGH (ref 4.6–6.5)

## 2017-08-28 MED ORDER — OMEPRAZOLE 20 MG PO CPDR: 20.0000 mg | DELAYED_RELEASE_CAPSULE | Freq: Every day | ORAL | 3 refills | Status: DC

## 2017-08-28 MED ORDER — SIMVASTATIN 40 MG PO TABS: 40.0000 mg | ORAL_TABLET | Freq: Every day | ORAL | 3 refills | Status: DC

## 2017-08-28 NOTE — Assessment & Plan Note (Signed)
I am concerned he is now diabetic Even if not, would suggest metformin

## 2017-08-28 NOTE — Assessment & Plan Note (Signed)
Healthy but out of shape Discussed healthy eating Due for colon Will check PSA after discussion Flu vaccine today

## 2017-08-28 NOTE — Assessment & Plan Note (Signed)
No problems with primary prevention 

## 2017-08-28 NOTE — Patient Instructions (Signed)
DASH Eating Plan DASH stands for "Dietary Approaches to Stop Hypertension." The DASH eating plan is a healthy eating plan that has been shown to reduce high blood pressure (hypertension). It may also reduce your risk for type 2 diabetes, heart disease, and stroke. The DASH eating plan may also help with weight loss. What are tips for following this plan? General guidelines  Avoid eating more than 2,300 mg (milligrams) of salt (sodium) a day. If you have hypertension, you may need to reduce your sodium intake to 1,500 mg a day.  Limit alcohol intake to no more than 1 drink a day for nonpregnant women and 2 drinks a day for men. One drink equals 12 oz of beer, 5 oz of wine, or 1 oz of hard liquor.  Work with your health care provider to maintain a healthy body weight or to lose weight. Ask what an ideal weight is for you.  Get at least 30 minutes of exercise that causes your heart to beat faster (aerobic exercise) most days of the week. Activities may include walking, swimming, or biking.  Work with your health care provider or diet and nutrition specialist (dietitian) to adjust your eating plan to your individual calorie needs. Reading food labels  Check food labels for the amount of sodium per serving. Choose foods with less than 5 percent of the Daily Value of sodium. Generally, foods with less than 300 mg of sodium per serving fit into this eating plan.  To find whole grains, look for the word "whole" as the first word in the ingredient list. Shopping  Buy products labeled as "low-sodium" or "no salt added."  Buy fresh foods. Avoid canned foods and premade or frozen meals. Cooking  Avoid adding salt when cooking. Use salt-free seasonings or herbs instead of table salt or sea salt. Check with your health care provider or pharmacist before using salt substitutes.  Do not fry foods. Cook foods using healthy methods such as baking, boiling, grilling, and broiling instead.  Cook with  heart-healthy oils, such as olive, canola, soybean, or sunflower oil. Meal planning   Eat a balanced diet that includes: ? 5 or more servings of fruits and vegetables each day. At each meal, try to fill half of your plate with fruits and vegetables. ? Up to 6-8 servings of whole grains each day. ? Less than 6 oz of lean meat, poultry, or fish each day. A 3-oz serving of meat is about the same size as a deck of cards. One egg equals 1 oz. ? 2 servings of low-fat dairy each day. ? A serving of nuts, seeds, or beans 5 times each week. ? Heart-healthy fats. Healthy fats called Omega-3 fatty acids are found in foods such as flaxseeds and coldwater fish, like sardines, salmon, and mackerel.  Limit how much you eat of the following: ? Canned or prepackaged foods. ? Food that is high in trans fat, such as fried foods. ? Food that is high in saturated fat, such as fatty meat. ? Sweets, desserts, sugary drinks, and other foods with added sugar. ? Full-fat dairy products.  Do not salt foods before eating.  Try to eat at least 2 vegetarian meals each week.  Eat more home-cooked food and less restaurant, buffet, and fast food.  When eating at a restaurant, ask that your food be prepared with less salt or no salt, if possible. What foods are recommended? The items listed may not be a complete list. Talk with your dietitian about what   dietary choices are best for you. Grains Whole-grain or whole-wheat bread. Whole-grain or whole-wheat pasta. Brown rice. Oatmeal. Quinoa. Bulgur. Whole-grain and low-sodium cereals. Pita bread. Low-fat, low-sodium crackers. Whole-wheat flour tortillas. Vegetables Fresh or frozen vegetables (raw, steamed, roasted, or grilled). Low-sodium or reduced-sodium tomato and vegetable juice. Low-sodium or reduced-sodium tomato sauce and tomato paste. Low-sodium or reduced-sodium canned vegetables. Fruits All fresh, dried, or frozen fruit. Canned fruit in natural juice (without  added sugar). Meat and other protein foods Skinless chicken or turkey. Ground chicken or turkey. Pork with fat trimmed off. Fish and seafood. Egg whites. Dried beans, peas, or lentils. Unsalted nuts, nut butters, and seeds. Unsalted canned beans. Lean cuts of beef with fat trimmed off. Low-sodium, lean deli meat. Dairy Low-fat (1%) or fat-free (skim) milk. Fat-free, low-fat, or reduced-fat cheeses. Nonfat, low-sodium ricotta or cottage cheese. Low-fat or nonfat yogurt. Low-fat, low-sodium cheese. Fats and oils Soft margarine without trans fats. Vegetable oil. Low-fat, reduced-fat, or light mayonnaise and salad dressings (reduced-sodium). Canola, safflower, olive, soybean, and sunflower oils. Avocado. Seasoning and other foods Herbs. Spices. Seasoning mixes without salt. Unsalted popcorn and pretzels. Fat-free sweets. What foods are not recommended? The items listed may not be a complete list. Talk with your dietitian about what dietary choices are best for you. Grains Baked goods made with fat, such as croissants, muffins, or some breads. Dry pasta or rice meal packs. Vegetables Creamed or fried vegetables. Vegetables in a cheese sauce. Regular canned vegetables (not low-sodium or reduced-sodium). Regular canned tomato sauce and paste (not low-sodium or reduced-sodium). Regular tomato and vegetable juice (not low-sodium or reduced-sodium). Pickles. Olives. Fruits Canned fruit in a light or heavy syrup. Fried fruit. Fruit in cream or butter sauce. Meat and other protein foods Fatty cuts of meat. Ribs. Fried meat. Bacon. Sausage. Bologna and other processed lunch meats. Salami. Fatback. Hotdogs. Bratwurst. Salted nuts and seeds. Canned beans with added salt. Canned or smoked fish. Whole eggs or egg yolks. Chicken or turkey with skin. Dairy Whole or 2% milk, cream, and half-and-half. Whole or full-fat cream cheese. Whole-fat or sweetened yogurt. Full-fat cheese. Nondairy creamers. Whipped toppings.  Processed cheese and cheese spreads. Fats and oils Butter. Stick margarine. Lard. Shortening. Ghee. Bacon fat. Tropical oils, such as coconut, palm kernel, or palm oil. Seasoning and other foods Salted popcorn and pretzels. Onion salt, garlic salt, seasoned salt, table salt, and sea salt. Worcestershire sauce. Tartar sauce. Barbecue sauce. Teriyaki sauce. Soy sauce, including reduced-sodium. Steak sauce. Canned and packaged gravies. Fish sauce. Oyster sauce. Cocktail sauce. Horseradish that you find on the shelf. Ketchup. Mustard. Meat flavorings and tenderizers. Bouillon cubes. Hot sauce and Tabasco sauce. Premade or packaged marinades. Premade or packaged taco seasonings. Relishes. Regular salad dressings. Where to find more information:  National Heart, Lung, and Blood Institute: www.nhlbi.nih.gov  American Heart Association: www.heart.org Summary  The DASH eating plan is a healthy eating plan that has been shown to reduce high blood pressure (hypertension). It may also reduce your risk for type 2 diabetes, heart disease, and stroke.  With the DASH eating plan, you should limit salt (sodium) intake to 2,300 mg a day. If you have hypertension, you may need to reduce your sodium intake to 1,500 mg a day.  When on the DASH eating plan, aim to eat more fresh fruits and vegetables, whole grains, lean proteins, low-fat dairy, and heart-healthy fats.  Work with your health care provider or diet and nutrition specialist (dietitian) to adjust your eating plan to your individual   calorie needs. This information is not intended to replace advice given to you by your health care provider. Make sure you discuss any questions you have with your health care provider. Document Released: 08/31/2011 Document Revised: 09/04/2016 Document Reviewed: 09/04/2016 Elsevier Interactive Patient Education  2017 Elsevier Inc.  

## 2017-08-28 NOTE — Assessment & Plan Note (Signed)
Some symptoms May be causing mucus in throat On PPI--now taking without food

## 2017-08-28 NOTE — Progress Notes (Signed)
Subjective:    Patient ID: Alejandro Hoover, male    DOB: [birth year], 61 y.o.   MRN: [id number]  HPI Here for physical  Having increased pain in feet--burning Left more than right Has noted increased thirst and urination benedryl helps if he uses it (rarely)---sensation is usually brief  Not being careful with eating Sugared drinks No exercise  Was having night reflux Changed omeprazole to evening and this is helping He cut back on evening eating No dysphagia--but gets mucus in throat  Current Outpatient Medications on File Prior to Visit  Medication Sig Dispense Refill  . omeprazole (PRILOSEC) 20 MG capsule TAKE 1 CAPSULE (20 MG TOTAL) BY MOUTH DAILY. 90 capsule 0  . simvastatin (ZOCOR) 40 MG tablet Take 1 tablet (40 mg total) by mouth daily. 90 tablet 3   No current facility-administered medications on file prior to visit.     No Known Allergies  Past Medical History:  Diagnosis Date  . Anxiety   . Depression   . ED (erectile dysfunction)   . Gastritis    mild   . GERD (gastroesophageal reflux disease)   . Hiatal hernia 09-28-2005  . Hyperlipidemia   . IBS (irritable bowel syndrome)     Past Surgical History:  Procedure Laterality Date  . CARDIOVASCULAR STRESS TEST     neg  . ESOPHAGOGASTRODUODENOSCOPY      Family History  Problem Relation Age of Onset  . Diabetes Mother   . Heart disease Mother   . Heart disease Father   . Alcohol abuse Father   . Liver disease Unknown        3 siblings  . Colon cancer Neg Hx   . Prostate cancer Neg Hx     Social History   Socioeconomic History  . Marital status: Married    Spouse name: Not on file  . Number of children: 2  . Years of education: Not on file  . Highest education level: Not on file  Social Needs  . Financial resource strain: Not on file  . Food insecurity - worry: Not on file  . Food insecurity - inability: Not on file  . Transportation needs - medical: Not on file  . Transportation needs -  non-medical: Not on file  Occupational History  . Occupation: Retired Scientist, research (medical)  . Occupation: Foster care in past  . Occupation: Writing a book  Tobacco Use  . Smoking status: Former Smoker    Types: Cigarettes    Last attempt to quit: 09/25/1968    Years since quitting: 48.9  . Smokeless tobacco: Never Used  Substance and Sexual Activity  . Alcohol use: Yes    Comment: 5-6 drinks a week   . Drug use: No  . Sexual activity: Not on file  Other Topics Concern  . Not on file  Social History Narrative   Married, 2 sons.     Review of Systems  Constitutional: Negative for fatigue and unexpected weight change.       Wears seat belt  HENT: Negative for dental problem, hearing loss, tinnitus and trouble swallowing.        Keeps up with dentist  Eyes: Negative for visual disturbance.       No diplopia or unilateral vision loss  Respiratory: Negative for cough, chest tightness and shortness of breath.   Cardiovascular: Negative for chest pain, palpitations and leg swelling.  Gastrointestinal: Negative for blood in stool and constipation.  Endocrine: Positive for polydipsia and polyuria.  Genitourinary: Negative for difficulty urinating and urgency.       No sexual problems Flow is okay  Musculoskeletal: Negative for arthralgias, back pain and joint swelling.  Skin: Negative for rash.       No suspicious lesions  Allergic/Immunologic: Negative for environmental allergies and immunocompromised state.  Neurological: Negative for dizziness, syncope, light-headedness and headaches.  Hematological: Negative for adenopathy. Bruises/bleeds easily.  Psychiatric/Behavioral: Negative for dysphoric mood. The patient is not nervous/anxious.        Only sleeps 4 hours a night--not tired       Objective:   Physical Exam  Constitutional: He is oriented to person, place, and time. He appears well-developed and well-nourished. No distress.  HENT:  Head: Normocephalic and atraumatic.  Right  Ear: External ear normal.  Left Ear: External ear normal.  Mouth/Throat: Oropharynx is clear and moist. No oropharyngeal exudate.  Eyes: Conjunctivae are normal. Pupils are equal, round, and reactive to light.  Neck: No thyromegaly present.  Cardiovascular: Normal rate, regular rhythm, normal heart sounds and intact distal pulses. Exam reveals no gallop.  No murmur heard. Pulmonary/Chest: Effort normal and breath sounds normal. No respiratory distress. He has no wheezes. He has no rales.  Abdominal: Soft. He exhibits no distension. There is no tenderness. There is no rebound.  Musculoskeletal: He exhibits no edema or tenderness.  Lymphadenopathy:    He has no cervical adenopathy.  Neurological: He is alert and oriented to person, place, and time.  Skin: No rash noted. No erythema.  Psychiatric: He has a normal mood and affect. His behavior is normal.          Assessment & Plan:

## 2017-08-28 NOTE — Assessment & Plan Note (Signed)
Burning pain but intermittent Diabetic? Check labs

## 2017-08-29 ENCOUNTER — Other Ambulatory Visit: Payer: Self-pay | Admitting: Internal Medicine

## 2017-08-29 DIAGNOSIS — R7301 Impaired fasting glucose: Secondary | ICD-10-CM

## 2017-08-29 MED ORDER — METFORMIN HCL ER 500 MG PO TB24: 500.0000 mg | ORAL_TABLET | Freq: Every day | ORAL | 3 refills | Status: DC

## 2017-08-30 ENCOUNTER — Other Ambulatory Visit (INDEPENDENT_AMBULATORY_CARE_PROVIDER_SITE_OTHER): Payer: 59

## 2017-08-30 DIAGNOSIS — R7301 Impaired fasting glucose: Secondary | ICD-10-CM

## 2017-08-30 LAB — RENAL FUNCTION PANEL
ALBUMIN: 4.7 g/dL (ref 3.5–5.2)
BUN: 9 mg/dL (ref 6–23)
CALCIUM: 8.9 mg/dL (ref 8.4–10.5)
CHLORIDE: 104 meq/L (ref 96–112)
CO2: 28 mEq/L (ref 19–32)
Creatinine, Ser: 0.92 mg/dL (ref 0.40–1.50)
GFR: 107.12 mL/min (ref >60.00)
Glucose, Bld: 117 mg/dL — ABNORMAL HIGH (ref 70–99)
POTASSIUM: 4.3 meq/L (ref 3.5–5.1)
Phosphorus: 2.6 mg/dL (ref 2.3–4.6)
Sodium: 140 mEq/L (ref 135–145)

## 2017-09-11 ENCOUNTER — Other Ambulatory Visit: Payer: Self-pay | Admitting: Family Medicine

## 2017-09-12 ENCOUNTER — Other Ambulatory Visit: Payer: Self-pay | Admitting: Internal Medicine

## 2017-09-20 ENCOUNTER — Other Ambulatory Visit: Payer: Self-pay | Admitting: Internal Medicine

## 2017-09-20 DIAGNOSIS — E785 Hyperlipidemia, unspecified: Secondary | ICD-10-CM

## 2017-09-20 DIAGNOSIS — R7301 Impaired fasting glucose: Secondary | ICD-10-CM

## 2017-09-28 ENCOUNTER — Telehealth: Payer: Self-pay | Admitting: Internal Medicine

## 2017-09-28 ENCOUNTER — Other Ambulatory Visit: Payer: 59

## 2017-09-28 NOTE — Telephone Encounter (Signed)
Please check with him Should set up labs for about a month--I think they are already ordered

## 2017-09-28 NOTE — Telephone Encounter (Signed)
PT came in for labs today but said he just started metformin meds 09/27/17. He did not get labs since just started meds and would like a call back to discuss next labs. Please see [place] with any questions.

## 2017-10-01 NOTE — Telephone Encounter (Signed)
I called pt and set up labs for 2/6.

## 2017-10-31 ENCOUNTER — Other Ambulatory Visit: Payer: 59

## 2017-11-01 ENCOUNTER — Encounter: Payer: Self-pay | Admitting: Internal Medicine

## 2017-11-05 ENCOUNTER — Telehealth: Payer: Self-pay | Admitting: Internal Medicine

## 2017-11-05 NOTE — Telephone Encounter (Signed)
Copied from [place]. Topic: Referral - Status >> Nov 05, 2017  3:00 PM Gerre Pebbles R wrote: Reason for CRM: LVM for pt to call back regarding his GI referral.  Ebony Hail call back [phone]

## 2018-02-06 ENCOUNTER — Ambulatory Visit: Payer: 59 | Admitting: Family Medicine

## 2018-02-07 ENCOUNTER — Encounter: Payer: Self-pay | Admitting: Family Medicine

## 2018-02-07 ENCOUNTER — Ambulatory Visit (INDEPENDENT_AMBULATORY_CARE_PROVIDER_SITE_OTHER)
Admission: RE | Admit: 2018-02-07 | Discharge: 2018-02-07 | Disposition: A | Discharge status: Home / Self Care | Payer: 59 | Source: Ambulatory Visit | Attending: Family Medicine | Admitting: Family Medicine

## 2018-02-07 ENCOUNTER — Ambulatory Visit (INDEPENDENT_AMBULATORY_CARE_PROVIDER_SITE_OTHER): Payer: 59 | Admitting: Family Medicine

## 2018-02-07 VITALS — BP 118/80 | HR 93 | Temp 98.9°F | Ht 72.5 in | Wt 222.8 lb

## 2018-02-07 DIAGNOSIS — R300 Dysuria: Secondary | ICD-10-CM

## 2018-02-07 LAB — POC URINALSYSI DIPSTICK (AUTOMATED)
BILIRUBIN UA: NEGATIVE
GLUCOSE UA: NEGATIVE
KETONES UA: NEGATIVE
Nitrite, UA: NEGATIVE
Spec Grav, UA: 1.015 (ref 1.010–1.025)
Urobilinogen, UA: 4 E.U./dL — AB
pH, UA: 6 (ref 5.0–8.0)

## 2018-02-07 MED ORDER — CIPROFLOXACIN HCL 500 MG PO TABS: 500.0000 mg | ORAL_TABLET | Freq: Two times a day (BID) | ORAL | 0 refills | Status: DC

## 2018-02-07 NOTE — Progress Notes (Signed)
Dysuria: burning with urination.  Blood in urine.   duration of symptoms: about 5 days ago.   abdominal pain: no Fevers: yes, as of yesterday.  Felt hot, had chills.  No measured temp at home.   back pain: no vomiting:  No No testicle pain.   Frequency and urgency of urination.  Stream can be slow to start recently.    No h/o renal stones.   No new sexual partners.    No sx like this prev.    He is getting episodically SOB over the last 5 days but not CP.    Some HA with a cough.   Some constipation.   Some nausea.  Dec in appetite recently.   No blood in stools.  No h/o prostatitis.    Meds, vitals, and allergies reviewed.   Per HPI unless specifically indicated in ROS section   GEN: nad, alert and oriented HEENT: mucous membranes moist NECK: supple CV: rrr.  PULM: ctab, no inc wob ABD: soft, +bs, suprapubic area not tender EXT: no edema SKIN: well perfused.  BACK: no CVA pain Prostate not ttp

## 2018-02-07 NOTE — Patient Instructions (Signed)
Drink plenty of water and start the antibiotics today.  We'll contact you with your lab report.  Go to the lab on the way out.  We'll contact you with your xray report. Take care.   Update [place] as needed.

## 2018-02-09 LAB — URINE CULTURE
MICRO NUMBER: 90598185
SPECIMEN QUALITY: ADEQUATE

## 2018-02-10 DIAGNOSIS — R300 Dysuria: Secondary | ICD-10-CM | POA: Insufficient documentation

## 2018-02-10 NOTE — Assessment & Plan Note (Signed)
Prostate not tender.  Benign exam overall.  Reasonable to check KUB given the dysuria and urinalysis.  Check urine culture.  Start Cipro.  See notes on labs and imaging.  Rationale discussed with patient.  He agrees.  Okay for outpatient follow-up.

## 2018-05-01 ENCOUNTER — Ambulatory Visit: Payer: Self-pay | Admitting: Internal Medicine

## 2018-05-01 NOTE — Telephone Encounter (Signed)
FYI to Dr Silvio Pate.

## 2018-05-01 NOTE — Telephone Encounter (Signed)
Pt called with concern of short term memory loss. Pt stated  he has trouble recalling what event could have happened 5 minutes prior but he can remember events clearly from 30 years ago. He stated that he is becoming more forgetful. He stated that he was accused by a friend of the family that the pt stole his friends car keys. The keys were found in his car but the pt has no recollection of taking them. Pt stated his wife was seen to rule out dementia. Pt stated he did worse on the exam than his wife did.  Pt stated that these symptoms have been happening for a few years but have worsened to the point that he is worried he has dementia or Alzheimers disease. Care advice given to pt and pt verbalized understanding. Pt given an appointment for August 9 at 12:30. Pt wanted to only see Dr Silvio Pate. Reason for Disposition . New or worsening memory (forgetfulness) problems  Answer Assessment - Initial Assessment Questions 1. MAIN CONCERN OR SYMPTOM:  "What is your main concern right now?" "What questions do you have?" "What's the main symptom you're worried about?" (e.g., confusion, memory loss)     Short term memory loss 2. ONSET:  "When did the symptom start (or worsen)?" (minutes, hours, days, weeks)     Few years ago 3. BETTER-SAME-WORSE: "Are you getting better, staying the same, or getting worse compared to the day you were discharged?" Worse 4.  DIAGNOSIS: "Was patient diagnosed with dementia by a doctor?" If yes, "When?" (e.g., days, months, years ago) no 5.  MEDICATION: "Has there been any change in medications recently?" (e.g., narcotics, antihistamines, benzodiazepines, etc.)no 6.  OTHER SYMPTOMS: "Are there any other symptoms?" (e.g., fever, cough, pain, falling)no     Getting worse- has never been diagnosed with memory problenms 7. SUPPORT: Document living circumstances and support (e.g., family, nursing home) Live at home with wife with a good support system  Protocols used: DEMENTIA SYMPTOMS  AND QUESTIONS-A-AH

## 2018-05-02 NOTE — Telephone Encounter (Signed)
Will see him and start work up Likely will need neurology evaluation as well

## 2018-05-03 ENCOUNTER — Ambulatory Visit (INDEPENDENT_AMBULATORY_CARE_PROVIDER_SITE_OTHER): Payer: 59 | Admitting: Internal Medicine

## 2018-05-03 ENCOUNTER — Encounter: Payer: Self-pay | Admitting: Internal Medicine

## 2018-05-03 DIAGNOSIS — R413 Other amnesia: Secondary | ICD-10-CM | POA: Insufficient documentation

## 2018-05-03 DIAGNOSIS — H9192 Unspecified hearing loss, left ear: Secondary | ICD-10-CM

## 2018-05-03 LAB — COMPREHENSIVE METABOLIC PANEL
ALT: 18 U/L (ref 0–53)
AST: 18 U/L (ref 0–37)
Albumin: 4.6 g/dL (ref 3.5–5.2)
Alkaline Phosphatase: 67 U/L (ref 39–117)
BILIRUBIN TOTAL: 0.7 mg/dL (ref 0.2–1.2)
BUN: 10 mg/dL (ref 6–23)
CALCIUM: 9.6 mg/dL (ref 8.4–10.5)
CHLORIDE: 104 meq/L (ref 96–112)
CO2: 27 mEq/L (ref 19–32)
Creatinine, Ser: 1.02 mg/dL (ref 0.40–1.50)
GFR: 94.88 mL/min (ref >60.00)
Glucose, Bld: 85 mg/dL (ref 70–99)
Potassium: 3.8 mEq/L (ref 3.5–5.1)
Sodium: 139 mEq/L (ref 135–145)
Total Protein: 8.2 g/dL (ref 6.0–8.3)

## 2018-05-03 LAB — T4, FREE: Free T4: 0.82 ng/dL (ref 0.60–1.60)

## 2018-05-03 LAB — CBC
HCT: 44.7 % (ref 39.0–52.0)
Hemoglobin: 14.9 g/dL (ref 13.0–17.0)
MCHC: 33.3 g/dL (ref 30.0–36.0)
MCV: 84.9 fl (ref 78.0–100.0)
Platelets: 231 10*3/uL (ref 150.0–400.0)
RBC: 5.26 Mil/uL (ref 4.22–5.81)
RDW: 13.6 % (ref 11.5–15.5)
WBC: 8.6 10*3/uL (ref 4.0–10.5)

## 2018-05-03 LAB — VITAMIN B12: Vitamin B-12: 312 pg/mL (ref 211–911)

## 2018-05-03 NOTE — Progress Notes (Signed)
Subjective:    Patient ID: Alejandro Hoover, male    DOB: 04-Sep-1956, 62 y.o.   MRN: [id number]  HPI Here with wife due to memory problems Wife has seen neurologist (Dr Tat) due to her memory problems. Had neuropsychiatric testing Wife is concerned for him because his memory seems worse He notices some problems---can't remember 2 things at a time without repeating them regularly Trouble bringing up words Goes back a few years but worsening Recently took daughter's keys in his car---though he had his in his hands Hasn't gotten lost in car No apraxia---no problem doing tasks, etc  Retired No foster care in a few years Still working on a book---but can't tell me much about it  Current Outpatient Medications on File Prior to Visit  Medication Sig Dispense Refill  . metFORMIN (GLUCOPHAGE-XR) 500 MG 24 hr tablet Take 1 tablet (500 mg total) by mouth daily with breakfast. 90 tablet 3  . omeprazole (PRILOSEC) 20 MG capsule Take 1 capsule (20 mg total) by mouth daily. 90 capsule 3  . simvastatin (ZOCOR) 40 MG tablet Take 1 tablet (40 mg total) by mouth daily. 90 tablet 3   No current facility-administered medications on file prior to visit.     No Known Allergies  Past Medical History:  Diagnosis Date  . Anxiety   . Depression   . ED (erectile dysfunction)   . Gastritis    mild   . GERD (gastroesophageal reflux disease)   . Hiatal hernia 09-28-2005  . Hyperlipidemia   . IBS (irritable bowel syndrome)     Past Surgical History:  Procedure Laterality Date  . CARDIOVASCULAR STRESS TEST     neg  . ESOPHAGOGASTRODUODENOSCOPY      Family History  Problem Relation Age of Onset  . Diabetes Mother   . Heart disease Mother   . Heart disease Father   . Alcohol abuse Father   . Liver disease Unknown        3 siblings  . Colon cancer Neg Hx   . Prostate cancer Neg Hx     Social History   Socioeconomic History  . Marital status: Married    Spouse name: Not on file  . Number  of children: 2  . Years of education: Not on file  . Highest education level: Not on file  Occupational History  . Occupation: Retired Scientist, research (medical)  . Occupation: Foster care in past  . Occupation: Writing a book  Social Needs  . Financial resource strain: Not on file  . Food insecurity:    Worry: Not on file    Inability: Not on file  . Transportation needs:    Medical: Not on file    Non-medical: Not on file  Tobacco Use  . Smoking status: Former Smoker    Types: Cigarettes    Last attempt to quit: 09/25/1968    Years since quitting: 49.6  . Smokeless tobacco: Never Used  Substance and Sexual Activity  . Alcohol use: Yes    Comment: 5-6 drinks a week   . Drug use: No  . Sexual activity: Not on file  Lifestyle  . Physical activity:    Days per week: Not on file    Minutes per session: Not on file  . Stress: Not on file  Relationships  . Social connections:    Talks on phone: Not on file    Gets together: Not on file    Attends religious service: Not on file  Active member of club or organization: Not on file    Attends meetings of clubs or organizations: Not on file    Relationship status: Not on file  . Intimate partner violence:    Fear of current or ex partner: Not on file    Emotionally abused: Not on file    Physically abused: Not on file    Forced sexual activity: Not on file  Other Topics Concern  . Not on file  Social History Narrative   Married, 2 sons.     Review of Systems No headaches No focal weakness No diplopia or unilateral vision loss No facial droop Not aphasic Feels fine No recent illness Sleeps okay--but wife's alarm awakens her at 4AM and some trouble getting back to sleep. No daytime somnolence    Objective:   Physical Exam  Constitutional: He appears well-developed. No distress.  HENT:  Mouth/Throat: Oropharynx is clear and moist. No oropharyngeal exudate.  Neck: No thyromegaly present.  Cardiovascular: Normal rate, regular  rhythm and normal heart sounds. Exam reveals no gallop.  No murmur heard. Respiratory: Effort normal and breath sounds normal. No respiratory distress. He has no wheezes. He has no rales.  GI: Soft. There is no tenderness.  Musculoskeletal: He exhibits no edema or tenderness.  Lymphadenopathy:    He has no cervical adenopathy.  Neurological: He has normal strength. He displays no tremor. No cranial nerve deficit. He exhibits normal muscle tone. He displays a negative Romberg sign. Coordination and gait normal.  Skin: No rash noted.  Psychiatric: He has a normal mood and affect. His behavior is normal.           Assessment & Plan:

## 2018-05-03 NOTE — Assessment & Plan Note (Signed)
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

## 2018-05-03 NOTE — Assessment & Plan Note (Signed)
Ongoing for some time No obvious toxin exposure (though wife also with issues) Nothing to suggest vascular events Will check MRI and labs Asked him to stop the statin If not improving, and no apparent etiology, will proceed with neurology evaluation

## 2018-05-03 NOTE — Patient Instructions (Signed)
Please stop the simvastatin for the next couple of months.

## 2018-05-06 LAB — HIV ANTIBODY (ROUTINE TESTING W REFLEX): HIV: NONREACTIVE

## 2018-05-06 LAB — RPR: RPR Ser Ql: NONREACTIVE

## 2018-05-12 ENCOUNTER — Ambulatory Visit
Admission: RE | Admit: 2018-05-12 | Discharge: 2018-05-12 | Disposition: A | Discharge status: Home / Self Care | Payer: 59 | Source: Ambulatory Visit | Attending: Internal Medicine | Admitting: Internal Medicine

## 2018-05-12 DIAGNOSIS — R413 Other amnesia: Secondary | ICD-10-CM

## 2018-05-13 ENCOUNTER — Other Ambulatory Visit: Payer: Self-pay | Admitting: Internal Medicine

## 2018-05-13 DIAGNOSIS — G930 Cerebral cysts: Secondary | ICD-10-CM

## 2018-05-14 ENCOUNTER — Telehealth: Payer: Self-pay | Admitting: Internal Medicine

## 2018-05-14 NOTE — Telephone Encounter (Signed)
Copied from [place] [id number]. Topic: Quick Communication - See Telephone Encounter >> May 14, 2018 10:07 AM Berneta Levins wrote: CRM for notification. See Telephone encounter for: 05/14/18.  CRM for notification. See Telephone encounter for: 05/14/18.    Pt calling he thinks that he has a referral to an ear specialist. Pt thinks someone from this office called him yesterday to give him the appt but he can't remember.  Pt can be reached at [phone]

## 2018-05-14 NOTE — Telephone Encounter (Signed)
Called the patient back and confirmed his ENT Appt info with him. The Appt was today at 9:15am. Patient misplaced the Appt info and he will call to reschedule the ENT Appt.

## 2018-06-11 ENCOUNTER — Ambulatory Visit: Payer: 59 | Admitting: Internal Medicine

## 2018-06-11 DIAGNOSIS — Z0289 Encounter for other administrative examinations: Secondary | ICD-10-CM

## 2018-08-01 ENCOUNTER — Other Ambulatory Visit: Payer: Self-pay | Admitting: Internal Medicine

## 2018-08-16 ENCOUNTER — Telehealth: Payer: Self-pay | Admitting: Internal Medicine

## 2018-08-16 NOTE — Telephone Encounter (Signed)
Pt wife is requesting to be put on waitlist for Shingrix. I called pt's wife to set up appt for Shingrix vaccine- appt made 09/03/18 and she said she forgot to put pt on the waitlist. She prefers for him to get injection 12/10 with her.

## 2018-08-20 NOTE — Telephone Encounter (Signed)
Dr. Silvio Pate,  Are you okay with me adding patient to schedule for Shingrix vaccination on 09/03/18?  Thanks.

## 2018-08-21 NOTE — Telephone Encounter (Signed)
I spoke with pt and scheduled with spouse 12/10 with [facility].

## 2018-08-21 NOTE — Telephone Encounter (Signed)
Sure----I am okay with anyone getting the shingrix

## 2018-08-30 ENCOUNTER — Ambulatory Visit (INDEPENDENT_AMBULATORY_CARE_PROVIDER_SITE_OTHER): Payer: 59 | Admitting: Internal Medicine

## 2018-08-30 ENCOUNTER — Encounter: Payer: Self-pay | Admitting: Internal Medicine

## 2018-08-30 ENCOUNTER — Ambulatory Visit (INDEPENDENT_AMBULATORY_CARE_PROVIDER_SITE_OTHER): Payer: 59

## 2018-08-30 VITALS — BP 136/86 | HR 63 | Temp 98.4°F | Ht 72.5 in | Wt 224.0 lb

## 2018-08-30 DIAGNOSIS — Z0000 Encounter for general adult medical examination without abnormal findings: Secondary | ICD-10-CM | POA: Diagnosis not present

## 2018-08-30 DIAGNOSIS — Z125 Encounter for screening for malignant neoplasm of prostate: Secondary | ICD-10-CM | POA: Diagnosis not present

## 2018-08-30 DIAGNOSIS — K219 Gastro-esophageal reflux disease without esophagitis: Secondary | ICD-10-CM | POA: Diagnosis not present

## 2018-08-30 DIAGNOSIS — Z1211 Encounter for screening for malignant neoplasm of colon: Secondary | ICD-10-CM

## 2018-08-30 DIAGNOSIS — R7303 Prediabetes: Secondary | ICD-10-CM | POA: Diagnosis not present

## 2018-08-30 DIAGNOSIS — Z23 Encounter for immunization: Secondary | ICD-10-CM | POA: Diagnosis not present

## 2018-08-30 LAB — PSA: PSA: 0.82 ng/mL (ref 0.10–4.00)

## 2018-08-30 LAB — HEMOGLOBIN A1C: Hgb A1c MFr Bld: 7 % — ABNORMAL HIGH (ref 4.6–6.5)

## 2018-08-30 NOTE — Assessment & Plan Note (Signed)
Discussed fitness DASH Will check PSA Set up colon Flu vaccine today

## 2018-08-30 NOTE — Assessment & Plan Note (Signed)
Okay on the PPI 

## 2018-08-30 NOTE — Assessment & Plan Note (Signed)
On metformin Not doing lifestyle--discussed Check A1c

## 2018-08-30 NOTE — Patient Instructions (Signed)
DASH Eating Plan DASH stands for "Dietary Approaches to Stop Hypertension." The DASH eating plan is a healthy eating plan that has been shown to reduce high blood pressure (hypertension). It may also reduce your risk for type 2 diabetes, heart disease, and stroke. The DASH eating plan may also help with weight loss. What are tips for following this plan? General guidelines  Avoid eating more than 2,300 mg (milligrams) of salt (sodium) a day. If you have hypertension, you may need to reduce your sodium intake to 1,500 mg a day.  Limit alcohol intake to no more than 1 drink a day for nonpregnant women and 2 drinks a day for men. One drink equals 12 oz of beer, 5 oz of wine, or 1 oz of hard liquor.  Work with your health care provider to maintain a healthy body weight or to lose weight. Ask what an ideal weight is for you.  Get at least 30 minutes of exercise that causes your heart to beat faster (aerobic exercise) most days of the week. Activities may include walking, swimming, or biking.  Work with your health care provider or diet and nutrition specialist (dietitian) to adjust your eating plan to your individual calorie needs. Reading food labels  Check food labels for the amount of sodium per serving. Choose foods with less than 5 percent of the Daily Value of sodium. Generally, foods with less than 300 mg of sodium per serving fit into this eating plan.  To find whole grains, look for the word "whole" as the first word in the ingredient list. Shopping  Buy products labeled as "low-sodium" or "no salt added."  Buy fresh foods. Avoid canned foods and premade or frozen meals. Cooking  Avoid adding salt when cooking. Use salt-free seasonings or herbs instead of table salt or sea salt. Check with your health care provider or pharmacist before using salt substitutes.  Do not fry foods. Cook foods using healthy methods such as baking, boiling, grilling, and broiling instead.  Cook with  heart-healthy oils, such as olive, canola, soybean, or sunflower oil. Meal planning   Eat a balanced diet that includes: ? 5 or more servings of fruits and vegetables each day. At each meal, try to fill half of your plate with fruits and vegetables. ? Up to 6-8 servings of whole grains each day. ? Less than 6 oz of lean meat, poultry, or fish each day. A 3-oz serving of meat is about the same size as a deck of cards. One egg equals 1 oz. ? 2 servings of low-fat dairy each day. ? A serving of nuts, seeds, or beans 5 times each week. ? Heart-healthy fats. Healthy fats called Omega-3 fatty acids are found in foods such as flaxseeds and coldwater fish, like sardines, salmon, and mackerel.  Limit how much you eat of the following: ? Canned or prepackaged foods. ? Food that is high in trans fat, such as fried foods. ? Food that is high in saturated fat, such as fatty meat. ? Sweets, desserts, sugary drinks, and other foods with added sugar. ? Full-fat dairy products.  Do not salt foods before eating.  Try to eat at least 2 vegetarian meals each week.  Eat more home-cooked food and less restaurant, buffet, and fast food.  When eating at a restaurant, ask that your food be prepared with less salt or no salt, if possible. What foods are recommended? The items listed may not be a complete list. Talk with your dietitian about what   dietary choices are best for you. Grains Whole-grain or whole-wheat bread. Whole-grain or whole-wheat pasta. Brown rice. Oatmeal. Quinoa. Bulgur. Whole-grain and low-sodium cereals. Pita bread. Low-fat, low-sodium crackers. Whole-wheat flour tortillas. Vegetables Fresh or frozen vegetables (raw, steamed, roasted, or grilled). Low-sodium or reduced-sodium tomato and vegetable juice. Low-sodium or reduced-sodium tomato sauce and tomato paste. Low-sodium or reduced-sodium canned vegetables. Fruits All fresh, dried, or frozen fruit. Canned fruit in natural juice (without  added sugar). Meat and other protein foods Skinless chicken or turkey. Ground chicken or turkey. Pork with fat trimmed off. Fish and seafood. Egg whites. Dried beans, peas, or lentils. Unsalted nuts, nut butters, and seeds. Unsalted canned beans. Lean cuts of beef with fat trimmed off. Low-sodium, lean deli meat. Dairy Low-fat (1%) or fat-free (skim) milk. Fat-free, low-fat, or reduced-fat cheeses. Nonfat, low-sodium ricotta or cottage cheese. Low-fat or nonfat yogurt. Low-fat, low-sodium cheese. Fats and oils Soft margarine without trans fats. Vegetable oil. Low-fat, reduced-fat, or light mayonnaise and salad dressings (reduced-sodium). Canola, safflower, olive, soybean, and sunflower oils. Avocado. Seasoning and other foods Herbs. Spices. Seasoning mixes without salt. Unsalted popcorn and pretzels. Fat-free sweets. What foods are not recommended? The items listed may not be a complete list. Talk with your dietitian about what dietary choices are best for you. Grains Baked goods made with fat, such as croissants, muffins, or some breads. Dry pasta or rice meal packs. Vegetables Creamed or fried vegetables. Vegetables in a cheese sauce. Regular canned vegetables (not low-sodium or reduced-sodium). Regular canned tomato sauce and paste (not low-sodium or reduced-sodium). Regular tomato and vegetable juice (not low-sodium or reduced-sodium). Pickles. Olives. Fruits Canned fruit in a light or heavy syrup. Fried fruit. Fruit in cream or butter sauce. Meat and other protein foods Fatty cuts of meat. Ribs. Fried meat. Bacon. Sausage. Bologna and other processed lunch meats. Salami. Fatback. Hotdogs. Bratwurst. Salted nuts and seeds. Canned beans with added salt. Canned or smoked fish. Whole eggs or egg yolks. Chicken or turkey with skin. Dairy Whole or 2% milk, cream, and half-and-half. Whole or full-fat cream cheese. Whole-fat or sweetened yogurt. Full-fat cheese. Nondairy creamers. Whipped toppings.  Processed cheese and cheese spreads. Fats and oils Butter. Stick margarine. Lard. Shortening. Ghee. Bacon fat. Tropical oils, such as coconut, palm kernel, or palm oil. Seasoning and other foods Salted popcorn and pretzels. Onion salt, garlic salt, seasoned salt, table salt, and sea salt. Worcestershire sauce. Tartar sauce. Barbecue sauce. Teriyaki sauce. Soy sauce, including reduced-sodium. Steak sauce. Canned and packaged gravies. Fish sauce. Oyster sauce. Cocktail sauce. Horseradish that you find on the shelf. Ketchup. Mustard. Meat flavorings and tenderizers. Bouillon cubes. Hot sauce and Tabasco sauce. Premade or packaged marinades. Premade or packaged taco seasonings. Relishes. Regular salad dressings. Where to find more information:  National Heart, Lung, and Blood Institute: www.nhlbi.nih.gov  American Heart Association: www.heart.org Summary  The DASH eating plan is a healthy eating plan that has been shown to reduce high blood pressure (hypertension). It may also reduce your risk for type 2 diabetes, heart disease, and stroke.  With the DASH eating plan, you should limit salt (sodium) intake to 2,300 mg a day. If you have hypertension, you may need to reduce your sodium intake to 1,500 mg a day.  When on the DASH eating plan, aim to eat more fresh fruits and vegetables, whole grains, lean proteins, low-fat dairy, and heart-healthy fats.  Work with your health care provider or diet and nutrition specialist (dietitian) to adjust your eating plan to your individual   calorie needs. This information is not intended to replace advice given to you by your health care provider. Make sure you discuss any questions you have with your health care provider. Document Released: 08/31/2011 Document Revised: 09/04/2016 Document Reviewed: 09/04/2016 Elsevier Interactive Patient Education  2018 Elsevier Inc.  

## 2018-08-30 NOTE — Progress Notes (Signed)
Subjective:    Patient ID: Alejandro Hoover, male    DOB: 02/26/1956, 62 y.o.   MRN: [id number]  HPI Here for physical  Has congestion now---like cold Thick heavy mucus in throat Started 2 weeks ago No headache or feet Some cough but no SOB  Did go to neurosurgeon They plan to repeat the MRI and only take action if it is worsening Did get hearing aide evaluation---holding off on buying (due to dispensing fees)  Still on the metformin Concerns about worsening neuropathy symptoms in feet Not really adjusting his diet No weight loss Not exercising at all---discussed  PPI daily This controls heartburn--with occasional flares No dysphagia  Current Outpatient Medications on File Prior to Visit  Medication Sig Dispense Refill  . metFORMIN (GLUCOPHAGE-XR) 500 MG 24 hr tablet Take 1 tablet (500 mg total) by mouth daily with breakfast. 90 tablet 3  . omeprazole (PRILOSEC) 20 MG capsule TAKE 1 CAPSULE BY MOUTH EVERY DAY 90 capsule 3   No current facility-administered medications on file prior to visit.     No Known Allergies  Past Medical History:  Diagnosis Date  . Anxiety   . Depression   . ED (erectile dysfunction)   . Gastritis    mild   . GERD (gastroesophageal reflux disease)   . Hiatal hernia 09-28-2005  . Hyperlipidemia   . IBS (irritable bowel syndrome)     Past Surgical History:  Procedure Laterality Date  . CARDIOVASCULAR STRESS TEST     neg  . ESOPHAGOGASTRODUODENOSCOPY      Family History  Problem Relation Age of Onset  . Diabetes Mother   . Heart disease Mother   . Heart disease Father   . Alcohol abuse Father   . Liver disease Unknown        3 siblings  . Colon cancer Neg Hx   . Prostate cancer Neg Hx     Social History   Socioeconomic History  . Marital status: Married    Spouse name: Not on file  . Number of children: 2  . Years of education: Not on file  . Highest education level: Not on file  Occupational History  . Occupation:  Retired Scientist, research (medical)  . Occupation: Foster care in past  . Occupation: Writing a book  Social Needs  . Financial resource strain: Not on file  . Food insecurity:    Worry: Not on file    Inability: Not on file  . Transportation needs:    Medical: Not on file    Non-medical: Not on file  Tobacco Use  . Smoking status: Former Smoker    Types: Cigarettes    Last attempt to quit: 09/25/1968    Years since quitting: 49.9  . Smokeless tobacco: Never Used  Substance and Sexual Activity  . Alcohol use: Yes    Comment: 5-6 drinks a week   . Drug use: No  . Sexual activity: Not on file  Lifestyle  . Physical activity:    Days per week: Not on file    Minutes per session: Not on file  . Stress: Not on file  Relationships  . Social connections:    Talks on phone: Not on file    Gets together: Not on file    Attends religious service: Not on file    Active member of club or organization: Not on file    Attends meetings of clubs or organizations: Not on file    Relationship status: Not on file  .  Intimate partner violence:    Fear of current or ex partner: Not on file    Emotionally abused: Not on file    Physically abused: Not on file    Forced sexual activity: Not on file  Other Topics Concern  . Not on file  Social History Narrative   Married, 2 sons.     Review of Systems  Constitutional: Negative for fatigue and unexpected weight change.       Wears seat belt  HENT: Positive for hearing loss. Negative for dental problem and tinnitus.        Keeps up with dentist  Eyes: Negative for visual disturbance.       No diplopia or unilateral vision loss  Respiratory: Negative for chest tightness and shortness of breath.   Cardiovascular: Negative for chest pain, palpitations and leg swelling.  Gastrointestinal: Negative for blood in stool and constipation.  Endocrine: Negative for polydipsia and polyuria.  Genitourinary: Positive for frequency and urgency.       Flow is okay No  sexual problems  Musculoskeletal: Negative for arthralgias, back pain and joint swelling.  Skin: Negative for rash.       No suspicious lesions  Allergic/Immunologic: Negative for environmental allergies and immunocompromised state.  Neurological: Negative for dizziness, syncope and light-headedness.       Rare headaches  Hematological: Negative for adenopathy. Bruises/bleeds easily.  Psychiatric/Behavioral: Negative for dysphoric mood and sleep disturbance. The patient is not nervous/anxious.        Objective:   Physical Exam  Constitutional: He is oriented to person, place, and time. He appears well-developed. No distress.  HENT:  Head: Normocephalic and atraumatic.  Right Ear: External ear normal.  Left Ear: External ear normal.  Mouth/Throat: Oropharynx is clear and moist. No oropharyngeal exudate.  Eyes: Pupils are equal, round, and reactive to light. Conjunctivae are normal.  Neck: No thyromegaly present.  Cardiovascular: Normal rate, regular rhythm, normal heart sounds and intact distal pulses. Exam reveals no gallop.  No murmur heard. Respiratory: Effort normal and breath sounds normal. No respiratory distress. He has no wheezes. He has no rales.  GI: Soft. There is no tenderness.  Musculoskeletal: He exhibits no edema or tenderness.  Lymphadenopathy:    He has no cervical adenopathy.  Neurological: He is alert and oriented to person, place, and time.  Skin: No rash noted. No erythema.  Psychiatric: He has a normal mood and affect. His behavior is normal.           Assessment & Plan:

## 2018-09-03 ENCOUNTER — Ambulatory Visit: Payer: 59

## 2018-09-05 ENCOUNTER — Encounter: Payer: Self-pay | Admitting: Gastroenterology

## 2018-09-28 ENCOUNTER — Other Ambulatory Visit: Payer: Self-pay | Admitting: Internal Medicine

## 2018-10-01 ENCOUNTER — Ambulatory Visit (AMBULATORY_SURGERY_CENTER): Payer: 59

## 2018-10-01 VITALS — Ht 72.0 in | Wt 229.0 lb

## 2018-10-01 DIAGNOSIS — Z1211 Encounter for screening for malignant neoplasm of colon: Secondary | ICD-10-CM

## 2018-10-01 MED ORDER — PEG 3350-KCL-NA BICARB-NACL 420 G PO SOLR: 4000.0000 mL | Freq: Once | ORAL | 0 refills | Status: AC

## 2018-10-01 NOTE — Progress Notes (Signed)
Denies allergies to eggs or soy products. Denies complication of anesthesia or sedation. Denies use of weight loss medication. Denies use of O2.   Emmi instructions declined.  

## 2018-10-04 ENCOUNTER — Telehealth: Payer: Self-pay | Admitting: Internal Medicine

## 2018-10-04 NOTE — Telephone Encounter (Signed)
Pt need refill for Metformin   Sent to [facility]

## 2018-10-04 NOTE — Telephone Encounter (Signed)
Spoke to pt to let him know an rx was called in on 09-30-18. He said he called [facility] and found that out.

## 2018-10-15 ENCOUNTER — Encounter: Payer: Self-pay | Admitting: Gastroenterology

## 2018-10-15 ENCOUNTER — Encounter: Payer: 59 | Admitting: Gastroenterology

## 2018-10-29 ENCOUNTER — Ambulatory Visit (AMBULATORY_SURGERY_CENTER): Payer: 59 | Admitting: Gastroenterology

## 2018-10-29 ENCOUNTER — Encounter: Payer: Self-pay | Admitting: Gastroenterology

## 2018-10-29 VITALS — BP 130/61 | HR 60 | Temp 97.5°F | Resp 18

## 2018-10-29 DIAGNOSIS — D124 Benign neoplasm of descending colon: Secondary | ICD-10-CM

## 2018-10-29 DIAGNOSIS — Z1211 Encounter for screening for malignant neoplasm of colon: Secondary | ICD-10-CM | POA: Diagnosis not present

## 2018-10-29 DIAGNOSIS — K649 Unspecified hemorrhoids: Secondary | ICD-10-CM

## 2018-10-29 MED ORDER — SODIUM CHLORIDE 0.9 % IV SOLN: 500.0000 mL | Freq: Once | INTRAVENOUS | Status: DC

## 2018-10-29 NOTE — Op Note (Signed)
[facility] Patient Name: [name] Procedure Date: 10/29/2018 10:42 AM MRN: [id number] Endoscopist: Milus Banister , MD Age: 63 Referring MD:  Date of Birth: [birth year] Gender: Male Account #: [id number] Procedure:                Colonoscopy Indications:              Screening for colorectal malignant neoplasm Medicines:                Monitored Anesthesia Care Procedure:                Pre-Anesthesia Assessment:                           - Prior to the procedure, a History and Physical                            was performed, and patient medications and                            allergies were reviewed. The patient's tolerance of                            previous anesthesia was also reviewed. The risks                            and benefits of the procedure and the sedation                            options and risks were discussed with the patient.                            All questions were answered, and informed consent                            was obtained. Prior Anticoagulants: The patient has                            taken no previous anticoagulant or antiplatelet                            agents. ASA Grade Assessment: II - A patient with                            mild systemic disease. After reviewing the risks                            and benefits, the patient was deemed in                            satisfactory condition to undergo the procedure.                           After obtaining informed consent, the colonoscope  was passed under direct vision. Throughout the                            procedure, the patient's blood pressure, pulse, and                            oxygen saturations were monitored continuously. The                            Colonoscope was introduced through the anus and                            advanced to the the cecum, identified by                            appendiceal orifice and  ileocecal valve. The                            colonoscopy was performed without difficulty. The                            patient tolerated the procedure well. The quality                            of the bowel preparation was good. The ileocecal                            valve, appendiceal orifice, and rectum were                            photographed. Scope In: 10:46:41 AM Scope Out: 11:00:22 AM Scope Withdrawal Time: 0 hours 10 minutes 15 seconds  Total Procedure Duration: 0 hours 13 minutes 41 seconds  Findings:                 Two sessile polyps were found in the descending                            colon. The polyps were 2 to 3 mm in size. These                            polyps were removed with a cold snare. Resection                            and retrieval were complete.                           Internal hemorrhoids.                           The exam was otherwise without abnormality on                            direct and retroflexion views. Complications:  No immediate complications. Estimated blood loss:                            None. Estimated Blood Loss:     Estimated blood loss: none. Impression:               - Two 2 to 3 mm polyps in the descending colon,                            removed with a cold snare. Resected and retrieved.                           - Internal hemorrhoids, medium sized.                           - The examination was otherwise normal on direct                            and retroflexion views. Recommendation:           - Patient has a contact number available for                            emergencies. The signs and symptoms of potential                            delayed complications were discussed with the                            patient. Return to normal activities tomorrow.                            Written discharge instructions were provided to the                            patient.                            - Resume previous diet.                           - Continue present medications.                           You will receive a letter within 2-3 weeks with the                            pathology results and my final recommendations.                           If the polyp(s) is proven to be 'pre-cancerous' on                            pathology, you will need repeat colonoscopy in 5  years. If the polyp(s) is NOT 'precancerous' on                            pathology then you should repeat colon cancer                            screening in 10 years with colonoscopy without need                            for colon cancer screening by any method prior to                            then (including stool testing). Milus Banister, MD 10/29/2018 11:03:20 AM This report has been signed electronically.

## 2018-10-29 NOTE — Progress Notes (Signed)
To PACU, VSS. Report to [email or web address] 

## 2018-10-29 NOTE — Patient Instructions (Signed)
Handouts Provided:  Polyps  YOU HAD AN ENDOSCOPIC PROCEDURE TODAY AT THE  ENDOSCOPY CENTER:   Refer to the procedure report that was given to you for any specific questions about what was found during the examination.  If the procedure report does not answer your questions, please call your gastroenterologist to clarify.  If you requested that your care partner not be given the details of your procedure findings, then the procedure report has been included in a sealed envelope for you to review at your convenience later.  YOU SHOULD EXPECT: Some feelings of bloating in the abdomen. Passage of more gas than usual.  Walking can help get rid of the air that was put into your GI tract during the procedure and reduce the bloating. If you had a lower endoscopy (such as a colonoscopy or flexible sigmoidoscopy) you may notice spotting of blood in your stool or on the toilet paper. If you underwent a bowel prep for your procedure, you may not have a normal bowel movement for a few days.  Please Note:  You might notice some irritation and congestion in your nose or some drainage.  This is from the oxygen used during your procedure.  There is no need for concern and it should clear up in a day or so.  SYMPTOMS TO REPORT IMMEDIATELY:   Following lower endoscopy (colonoscopy or flexible sigmoidoscopy):  Excessive amounts of blood in the stool  Significant tenderness or worsening of abdominal pains  Swelling of the abdomen that is new, acute  Fever of 100F or higher  For urgent or emergent issues, a gastroenterologist can be reached at any hour by calling [phone].   DIET:  We do recommend a small meal at first, but then you may proceed to your regular diet.  Drink plenty of fluids but you should avoid alcoholic beverages for 24 hours.  ACTIVITY:  You should plan to take it easy for the rest of today and you should NOT DRIVE or use heavy machinery until tomorrow (because of the sedation  medicines used during the test).    FOLLOW UP: Our staff will call the number listed on your records the next business day following your procedure to check on you and address any questions or concerns that you may have regarding the information given to you following your procedure. If we do not reach you, we will leave a message.  However, if you are feeling well and you are not experiencing any problems, there is no need to return our call.  We will assume that you have returned to your regular daily activities without incident.  If any biopsies were taken you will be contacted by phone or by letter within the next 1-3 weeks.  Please call us at [phone] if you have not heard about the biopsies in 3 weeks.    SIGNATURES/CONFIDENTIALITY: You and/or your care partner have signed paperwork which will be entered into your electronic medical record.  These signatures attest to the fact that that the information above on your After Visit Summary has been reviewed and is understood.  Full responsibility of the confidentiality of this discharge information lies with you and/or your care-partner.  

## 2018-10-29 NOTE — Progress Notes (Signed)
Pt's states no medical or surgical changes since previsit or office visit. 

## 2018-10-30 ENCOUNTER — Telehealth: Payer: Self-pay

## 2018-10-30 NOTE — Telephone Encounter (Signed)
  Follow up Call-  Call back number 10/29/2018  Post procedure Call Back phone  # [phone]  Permission to leave phone message Yes  Some recent data might be hidden     Patient questions:  Do you have a fever, pain , or abdominal swelling? No. Pain Score  0 *  Have you tolerated food without any problems? Yes.    Have you been able to return to your normal activities? Yes.    Do you have any questions about your discharge instructions: Diet   No. Medications  No. Follow up visit  No.  Do you have questions or concerns about your Care? No.  Actions: * If pain score is 4 or above: No action needed, pain <4.

## 2018-11-05 ENCOUNTER — Encounter: Payer: Self-pay | Admitting: Gastroenterology

## 2018-11-05 ENCOUNTER — Ambulatory Visit: Payer: 59

## 2018-11-06 ENCOUNTER — Ambulatory Visit: Payer: 59

## 2018-11-26 ENCOUNTER — Ambulatory Visit (INDEPENDENT_AMBULATORY_CARE_PROVIDER_SITE_OTHER): Payer: 59

## 2018-11-26 DIAGNOSIS — Z23 Encounter for immunization: Secondary | ICD-10-CM

## 2019-05-27 IMAGING — MR MR HEAD W/O CM
9 of 10 series · 42 of 48 positions shown · non-contrast
Comparison: None.

CLINICAL DATA: 61-year-old male with memory problems and ongoing
cognitive decline.

EXAM:
MRI HEAD WITHOUT CONTRAST
TECHNIQUE: Multiplanar, multiecho pulse sequences of the brain and surrounding
structures were obtained without intravenous contrast.

[Series 2: T1 · sagittal · 5.0mm · 0.47mm/px · 3 of 23 slices shown]
[im 1/23]
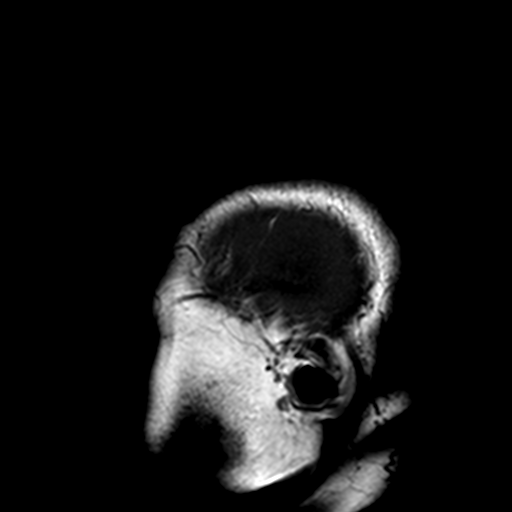
[im 12/23]
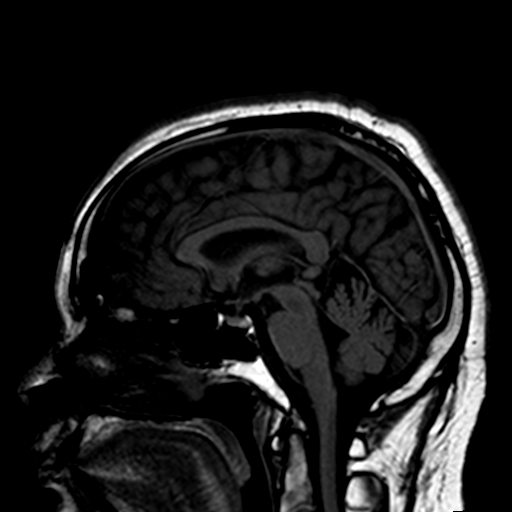
[im 23/23]
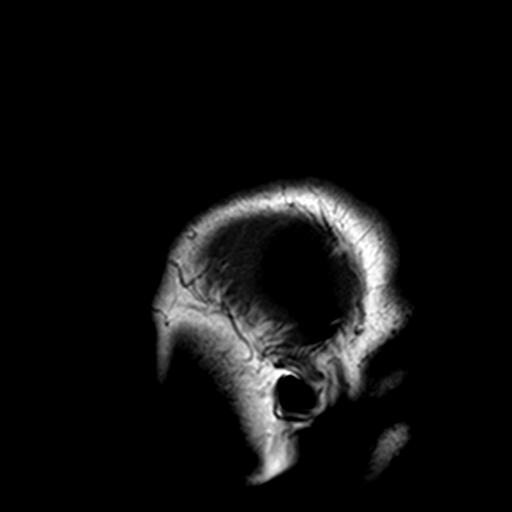

[Series 3: DWI · axial · 3.0mm · 1.86mm/px · z∈[-83,+71]mm · 9 of 95 slices shown (1 of 4)]
[im 1/95]
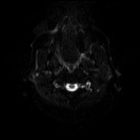
[im 12/95]
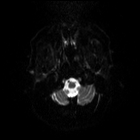
[im 24/95]
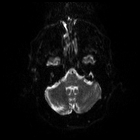
[im 36/95]
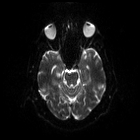
[im 48/95]
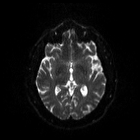
[im 59/95]
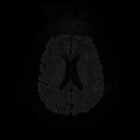
[im 71/95]
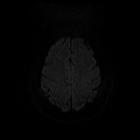
[im 83/95]
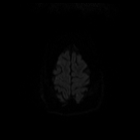
[im 95/95]
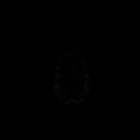

[Series 4: DWI · axial · 3.0mm · 1.86mm/px · z∈[-83,+71]mm · 5 of 48 slices shown (2 of 4)]
[im 1/48]
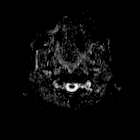
[im 12/48]
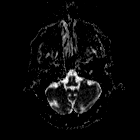
[im 24/48]
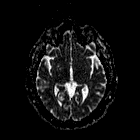
[im 36/48]
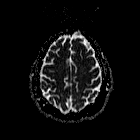
[im 48/48]
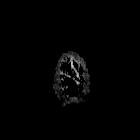

[Series 5: DWI · coronal · 3.0mm · 1.35mm/px · 10 of 102 slices shown (3 of 4)]
[im 1/102]
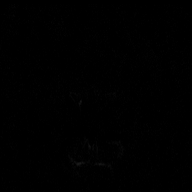
[im 12/102]
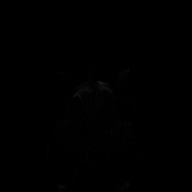
[im 23/102]
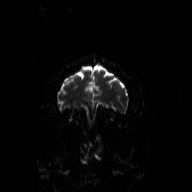
[im 34/102]
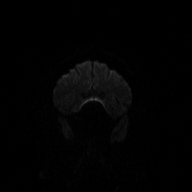
[im 45/102]
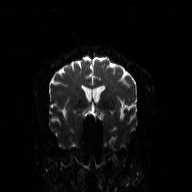
[im 57/102]
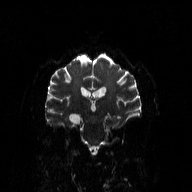
[im 68/102]
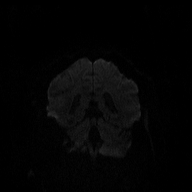
[im 79/102]
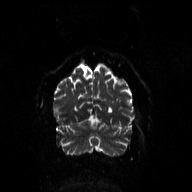
[im 90/102]
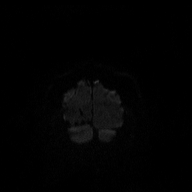
[im 102/102]
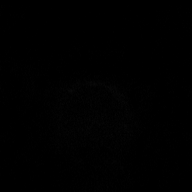

[Series 6: DWI · coronal · 3.0mm · 1.35mm/px · 5 of 51 slices shown (4 of 4)]
[im 1/51]
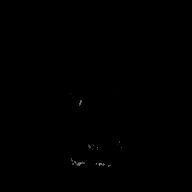
[im 13/51]
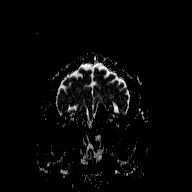
[im 26/51]
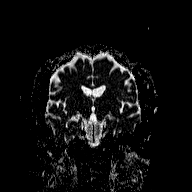
[im 38/51]
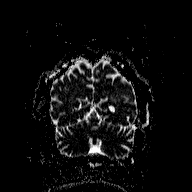
[im 51/51]
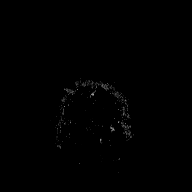

[Series 7: T2 · axial · 5.0mm · 0.47mm/px · z∈[-80,+81]mm · 2 of 24 slices shown (1 of 2)]
[im 1/24]
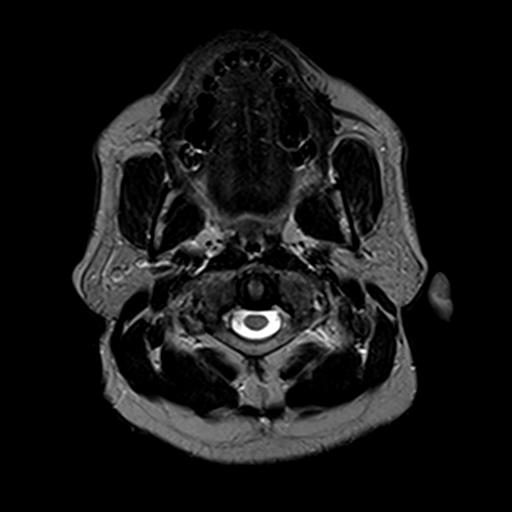
[im 24/24]
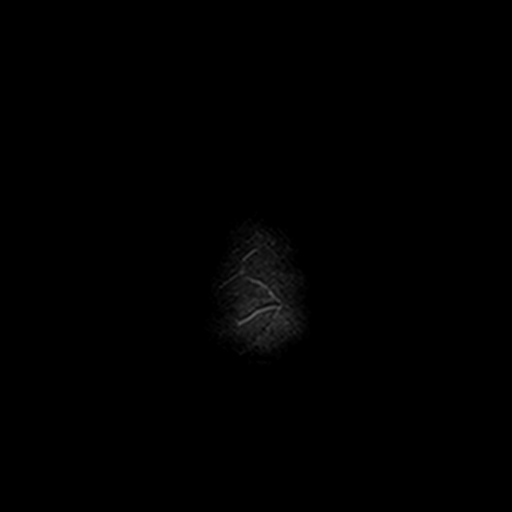

[Series 8: T2 · axial · 5.0mm · 0.47mm/px · z∈[-80,+81]mm · 2 of 24 slices shown (2 of 2)]
[im 1/24]
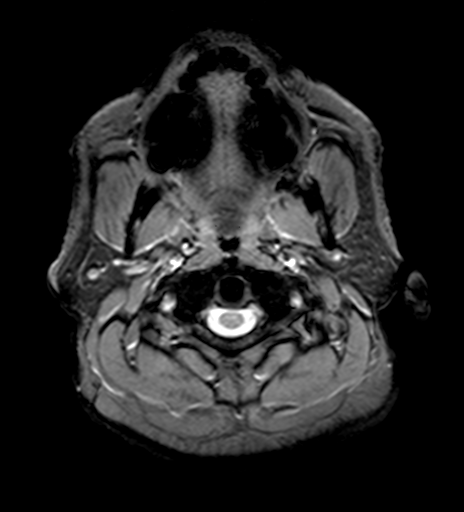
[im 24/24]
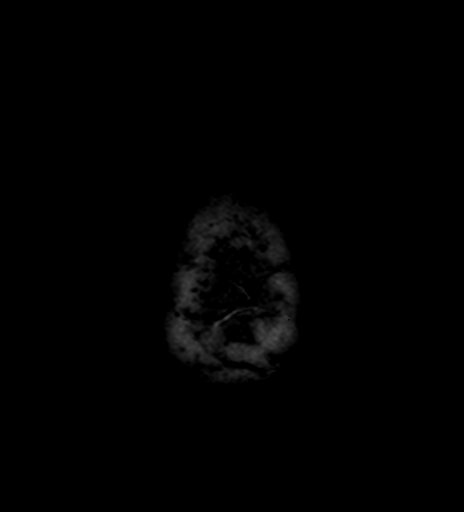

[Series 9: FLAIR · axial · 4.0mm · 0.47mm/px · z∈[-77,+78]mm · 3 of 27 slices shown]
[im 1/27]
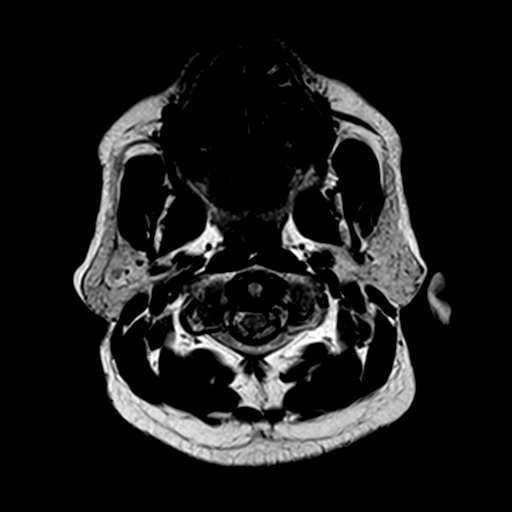
[im 14/27]
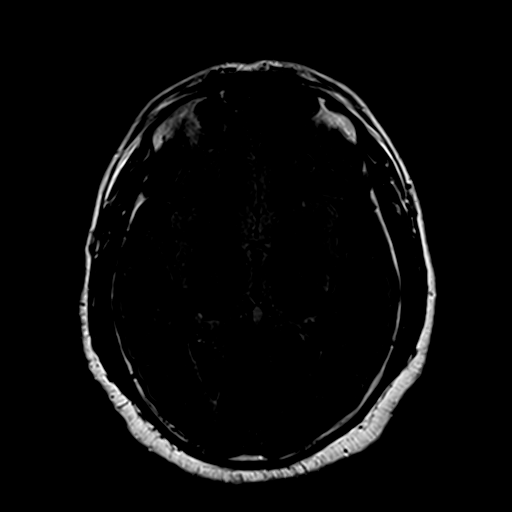
[im 27/27]
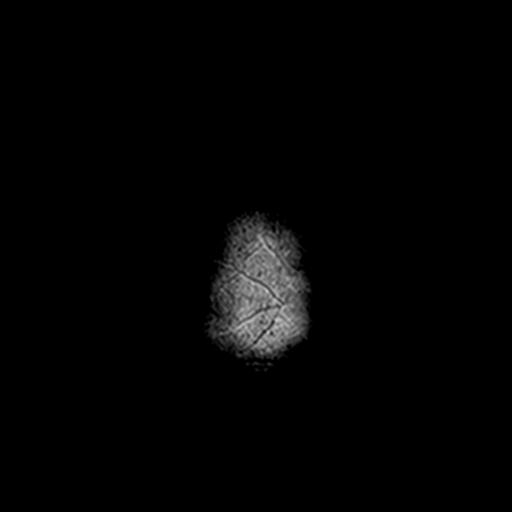

[Series 11: T2 post-contrast · coronal · 5.0mm · 0.45mm/px · 3 of 30 slices shown]
[im 1/30]
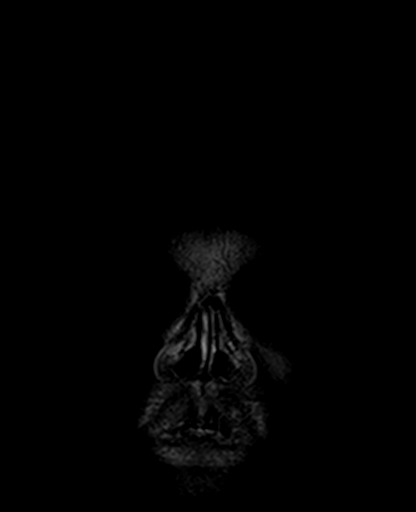
[im 15/30]
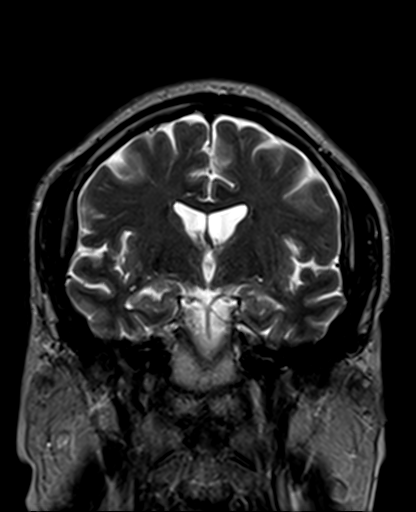
[im 30/30]
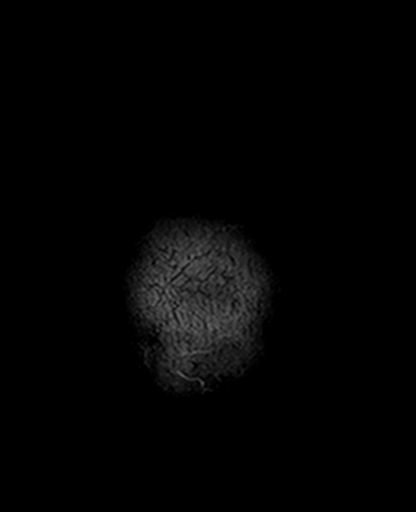

[42 of 48 positions shown; findings below may reference images not displayed]

FINDINGS: Brain: In the mesial right temporal lobe there is a smooth, round,
15 x 17 millimeter CSF isointense cyst compatible with a right
choroidal fissure cyst, or less likely arachnoid cyst. There is mild
regional mass effect, including on the right hippocampal formation,
but no associated edema, blood products, or mineralization. No
mesial temporal lobe signal abnormality is associated. No other
extra-axial fluid collection.

Background brain volume is normal for age. No regional areas of
brain atrophy are identified. No restricted diffusion to suggest
acute infarction. No midline shift, ventriculomegaly, or acute
intracranial hemorrhage. Cervicomedullary junction and pituitary are
within normal limits.

No cortical encephalomalacia. No chronic cerebral blood products.
Minimal to mild for age nonspecific scattered small cerebral white
matter T2 and FLAIR hyperintensity. The deep gray matter nuclei,
brainstem, and cerebellum appear normal.

Vascular: Major intracranial vascular flow voids are preserved. The
distal right vertebral artery appears mildly dominant.

However, there are abnormal leptomeningeal flow voids in the right
occipital lobe best demonstrated on series 11, image 4. There is no
associated night is or central tangle of abnormal vessels. Some of
these veins appear to communicate with both the straight sinus and
the right sigmoid sinus (series 7, image 10 and series 8, image 10).

Skull and upper cervical spine: Negative visible cervical spine.
Normal bone marrow signal.

Sinuses/Orbits: Normal orbits soft tissues. Trace paranasal sinus
mucosal thickening.

Other: Mastoid air cells are clear. Grossly normal visible internal
auditory structures. Scalp and face soft tissues appear negative.
IMPRESSION: 1. There is a 17 mm CSF containing simple cyst within the right
mesial temporal lobe with mild regional mass effect, including on
the right hippocampus. Still, this is favored to reflect a right
choroidal fissure cyst which are typically a clinically silent
normal variant.
There are rare reported instances of enlarging, symptomatic cysts.
Comparison with any prior brain imaging would be valuable.
2. Abnormal leptomeningeal vessels along the posterior right
occipital lobe raising the possibility of a vascular malformation
such as dural fistula. The features are not typical of AVM. An
unusual developmental venous anomaly (normal variant) is felt less
likely.
There is no associated parenchymal edema to strongly suggest this is
symptomatic. Query left visual symptoms. Follow-up post-contrast MRI
imaging of the brain may be valuable to further characterize.
3. No superimposed acute or chronic infarcts, cerebral volume loss,
or other intracranial abnormality.

## 2019-06-17 ENCOUNTER — Other Ambulatory Visit: Payer: Self-pay

## 2019-06-17 ENCOUNTER — Encounter: Payer: Self-pay | Admitting: Internal Medicine

## 2019-06-17 ENCOUNTER — Ambulatory Visit (INDEPENDENT_AMBULATORY_CARE_PROVIDER_SITE_OTHER): Payer: 59 | Admitting: Internal Medicine

## 2019-06-17 VITALS — BP 156/92 | HR 76 | Temp 97.7°F | Ht 72.5 in | Wt 225.0 lb

## 2019-06-17 DIAGNOSIS — Z23 Encounter for immunization: Secondary | ICD-10-CM | POA: Diagnosis not present

## 2019-06-17 DIAGNOSIS — L089 Local infection of the skin and subcutaneous tissue, unspecified: Secondary | ICD-10-CM | POA: Diagnosis not present

## 2019-06-17 DIAGNOSIS — L723 Sebaceous cyst: Secondary | ICD-10-CM

## 2019-06-17 MED ORDER — CEPHALEXIN 500 MG PO CAPS: 500.0000 mg | ORAL_CAPSULE | Freq: Three times a day (TID) | ORAL | 1 refills | Status: DC

## 2019-06-17 NOTE — Addendum Note (Signed)
Addended by: Pilar Grammes on: 06/17/2019 01:46 PM   Modules accepted: Orders

## 2019-06-17 NOTE — Progress Notes (Signed)
Subjective:    Patient ID: Alejandro Hoover, male    DOB: 1956-04-04, 63 y.o.   MRN: [id number]  HPI Here due to lump on his back  Has been there for a year or so Similar to other bumps he has had--but now painful and tender Seems to get bigger No known trauma No drainage  Current Outpatient Medications on File Prior to Visit  Medication Sig Dispense Refill  . metFORMIN (GLUCOPHAGE-XR) 500 MG 24 hr tablet TAKE 1 TABLET BY MOUTH ONCE DAILY WITH BREAKFAST 90 tablet 3  . omeprazole (PRILOSEC) 20 MG capsule TAKE 1 CAPSULE BY MOUTH EVERY DAY 90 capsule 3   No current facility-administered medications on file prior to visit.     No Known Allergies  Past Medical History:  Diagnosis Date  . Anxiety   . Depression   . ED (erectile dysfunction)   . Gastritis    mild   . GERD (gastroesophageal reflux disease)   . Heart murmur   . Hiatal hernia 09-28-2005  . Hyperlipidemia   . IBS (irritable bowel syndrome)     Past Surgical History:  Procedure Laterality Date  . CARDIOVASCULAR STRESS TEST     neg  . ESOPHAGOGASTRODUODENOSCOPY      Family History  Problem Relation Age of Onset  . Diabetes Mother   . Heart disease Mother   . Heart disease Father   . Alcohol abuse Father   . Liver disease Other        3 siblings  . Colon cancer Neg Hx   . Prostate cancer Neg Hx   . Esophageal cancer Neg Hx   . Rectal cancer Neg Hx   . Stomach cancer Neg Hx     Social History   Socioeconomic History  . Marital status: Married    Spouse name: Not on file  . Number of children: 2  . Years of education: Not on file  . Highest education level: Not on file  Occupational History  . Occupation: Retired Scientist, research (medical)  . Occupation: Foster care in past  . Occupation: Writing a book  Social Needs  . Financial resource strain: Not on file  . Food insecurity    Worry: Not on file    Inability: Not on file  . Transportation needs    Medical: Not on file    Non-medical: Not on file   Tobacco Use  . Smoking status: Former Smoker    Types: Cigarettes    Quit date: 09/25/1968    Years since quitting: 50.7  . Smokeless tobacco: Never Used  Substance and Sexual Activity  . Alcohol use: Yes    Comment: 5-6 drinks a week mostly beer  . Drug use: Yes    Types: Marijuana  . Sexual activity: Not on file  Lifestyle  . Physical activity    Days per week: Not on file    Minutes per session: Not on file  . Stress: Not on file  Relationships  . Social Herbalist on phone: Not on file    Gets together: Not on file    Attends religious service: Not on file    Active member of club or organization: Not on file    Attends meetings of clubs or organizations: Not on file    Relationship status: Not on file  . Intimate partner violence    Fear of current or ex partner: Not on file    Emotionally abused: Not on file  Physically abused: Not on file    Forced sexual activity: Not on file  Other Topics Concern  . Not on file  Social History Narrative   Married, 2 sons.     Review of Systems  No fever No anxiety No problems with BP that he knows of     Objective:   Physical Exam  Skin:  ~3cm diameter raised mass on posterior left shoulder Inferior portion is red, warm and the entire mass is tender No drainage or fluctuance           Assessment & Plan:

## 2019-06-17 NOTE — Assessment & Plan Note (Signed)
Reassured ---benign Discussed heat to promote drainage if possible Will try cephalexin ---- but will change to doxy if that doesn't work

## 2019-09-01 ENCOUNTER — Other Ambulatory Visit: Payer: Self-pay

## 2019-09-02 ENCOUNTER — Ambulatory Visit (INDEPENDENT_AMBULATORY_CARE_PROVIDER_SITE_OTHER): Payer: 59 | Admitting: Internal Medicine

## 2019-09-02 ENCOUNTER — Encounter: Payer: Self-pay | Admitting: Internal Medicine

## 2019-09-02 VITALS — BP 128/68 | HR 68 | Temp 97.7°F | Ht 72.25 in | Wt 222.0 lb

## 2019-09-02 DIAGNOSIS — K219 Gastro-esophageal reflux disease without esophagitis: Secondary | ICD-10-CM | POA: Diagnosis not present

## 2019-09-02 DIAGNOSIS — R200 Anesthesia of skin: Secondary | ICD-10-CM | POA: Diagnosis not present

## 2019-09-02 DIAGNOSIS — R7303 Prediabetes: Secondary | ICD-10-CM | POA: Diagnosis not present

## 2019-09-02 DIAGNOSIS — Z0000 Encounter for general adult medical examination without abnormal findings: Secondary | ICD-10-CM

## 2019-09-02 DIAGNOSIS — G930 Cerebral cysts: Secondary | ICD-10-CM

## 2019-09-02 LAB — CBC
HCT: 46.3 % (ref 39.0–52.0)
Hemoglobin: 15 g/dL (ref 13.0–17.0)
MCHC: 32.4 g/dL (ref 30.0–36.0)
MCV: 87.4 fl (ref 78.0–100.0)
Platelets: 217 10*3/uL (ref 150.0–400.0)
RBC: 5.29 Mil/uL (ref 4.22–5.81)
RDW: 13.4 % (ref 11.5–15.5)
WBC: 7.2 10*3/uL (ref 4.0–10.5)

## 2019-09-02 LAB — COMPREHENSIVE METABOLIC PANEL
ALT: 23 U/L (ref 0–53)
AST: 18 U/L (ref 0–37)
Albumin: 4.4 g/dL (ref 3.5–5.2)
Alkaline Phosphatase: 72 U/L (ref 39–117)
BUN: 11 mg/dL (ref 6–23)
CO2: 27 mEq/L (ref 19–32)
Calcium: 9.3 mg/dL (ref 8.4–10.5)
Chloride: 104 mEq/L (ref 96–112)
Creatinine, Ser: 0.97 mg/dL (ref 0.40–1.50)
GFR: 94.2 mL/min (ref >60.00)
Glucose, Bld: 108 mg/dL — ABNORMAL HIGH (ref 70–99)
Potassium: 4.1 mEq/L (ref 3.5–5.1)
Sodium: 139 mEq/L (ref 135–145)
Total Bilirubin: 0.6 mg/dL (ref 0.2–1.2)
Total Protein: 7.3 g/dL (ref 6.0–8.3)

## 2019-09-02 LAB — HEMOGLOBIN A1C: Hgb A1c MFr Bld: 6.8 % — ABNORMAL HIGH (ref 4.6–6.5)

## 2019-09-02 LAB — LIPID PANEL
Cholesterol: 256 mg/dL — ABNORMAL HIGH (ref 0–200)
HDL: 48.6 mg/dL (ref >39.00)
LDL Cholesterol: 180 mg/dL — ABNORMAL HIGH (ref 0–99)
NonHDL: 207.28
Total CHOL/HDL Ratio: 5
Triglycerides: 136 mg/dL (ref 0.0–149.0)
VLDL: 27.2 mg/dL (ref 0.0–40.0)

## 2019-09-02 LAB — VITAMIN B12: Vitamin B-12: 236 pg/mL (ref 211–911)

## 2019-09-02 LAB — T4, FREE: Free T4: 0.67 ng/dL (ref 0.60–1.60)

## 2019-09-02 MED ORDER — OMEPRAZOLE 20 MG PO CPDR: DELAYED_RELEASE_CAPSULE | ORAL | 3 refills | Status: DC

## 2019-09-02 MED ORDER — METFORMIN HCL ER 500 MG PO TB24: ORAL_TABLET | ORAL | 3 refills | Status: DC

## 2019-09-02 NOTE — Progress Notes (Signed)
Subjective:    Patient ID: Alejandro Hoover, male    DOB: [birth year], 63 y.o.   MRN: [id number]  HPI Here for physical  This visit occurred during the SARS-CoV-2 public health emergency.  Safety protocols were in place, including screening questions prior to the visit, additional usage of staff PPE, and extensive cleaning of exam room while observing appropriate contact time as indicated for disinfecting solutions.   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 Will relieve with walking or will take a benedryl  Current Outpatient Medications on File Prior to Visit  Medication Sig Dispense Refill  . metFORMIN (GLUCOPHAGE-XR) 500 MG 24 hr tablet TAKE 1 TABLET BY MOUTH ONCE DAILY WITH BREAKFAST 90 tablet 3  . omeprazole (PRILOSEC) 20 MG capsule TAKE 1 CAPSULE BY MOUTH EVERY DAY 90 capsule 3   No current facility-administered medications on file prior to visit.     No Known Allergies  Past Medical History:  Diagnosis Date  . Anxiety   . Depression   . ED (erectile dysfunction)   . Gastritis    mild   . GERD (gastroesophageal reflux disease)   . Heart murmur   . Hiatal hernia 09-28-2005  . Hyperlipidemia   . IBS (irritable bowel syndrome)     Past Surgical History:  Procedure Laterality Date  . CARDIOVASCULAR STRESS TEST     neg  . ESOPHAGOGASTRODUODENOSCOPY      Family History  Problem Relation Age of Onset  . Diabetes Mother   . Heart disease Mother   . Heart disease Father   . Alcohol abuse Father   . Liver disease Other        3 siblings  . Colon cancer Neg Hx   . Prostate cancer Neg Hx   . Esophageal cancer Neg Hx   . Rectal cancer Neg Hx   .  Stomach cancer Neg Hx     Social History   Socioeconomic History  . Marital status: Married    Spouse name: Not on file  . Number of children: 2  . Years of education: Not on file  . Highest education level: Not on file  Occupational History  . Occupation: Retired Scientist, research (medical)  . Occupation: Foster care in past  . Occupation: Writing a book  Social Needs  . Financial resource strain: Not on file  . Food insecurity    Worry: Not on file    Inability: Not on file  . Transportation needs    Medical: Not on file    Non-medical: Not on file  Tobacco Use  . Smoking status: Former Smoker    Types: Cigarettes    Quit date: 09/25/1968    Years since quitting: 50.9  . Smokeless tobacco: Never Used  Substance and Sexual Activity  . Alcohol use: Yes    Comment: 5-6 drinks a week mostly beer  . Drug use: Yes    Types: Marijuana  . Sexual activity: Not on file  Lifestyle  . Physical activity    Days per week: Not on file    Minutes per session: Not on file  . Stress: Not on file  Relationships  . Social connections  Talks on phone: Not on file    Gets together: Not on file    Attends religious service: Not on file    Active member of club or organization: Not on file    Attends meetings of clubs or organizations: Not on file    Relationship status: Not on file  . Intimate partner violence    Fear of current or ex partner: Not on file    Emotionally abused: Not on file    Physically abused: Not on file    Forced sexual activity: Not on file  Other Topics Concern  . Not on file  Social History Narrative   Married, 2 sons.     Review of Systems  Constitutional:       Weight down slightly Wears seat belt  HENT: Negative for dental problem, tinnitus and trouble swallowing.        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       No diplopia or unilateral vision loss  Respiratory: Negative for cough, chest tightness and shortness  of breath.   Cardiovascular: Negative for chest pain, palpitations and leg swelling.  Gastrointestinal: Negative for blood in stool and constipation.       Heartburn controlled with PPI  Endocrine: Positive for polydipsia. Negative for polyuria.  Genitourinary: Negative for difficulty urinating and urgency.       No sexual problems  Musculoskeletal: Negative for arthralgias, back pain and joint swelling.  Skin: Negative for rash.       No suspicious lesions  Allergic/Immunologic: Positive for environmental allergies. Negative for immunocompromised state.       Rare problems---no meds  Neurological: Negative for dizziness, syncope, weakness and light-headedness.  Hematological: Negative for adenopathy. Bruises/bleeds easily.  Psychiatric/Behavioral: Negative for dysphoric mood and sleep disturbance. The patient is not nervous/anxious.        Objective:   Physical Exam  Constitutional: He is oriented to person, place, and time. He appears well-developed. No distress.  HENT:  Head: Normocephalic and atraumatic.  Right Ear: External ear normal.  Left Ear: External ear normal.  Mouth/Throat: Oropharynx is clear and moist. No oropharyngeal exudate.  Eyes: Pupils are equal, round, and reactive to light. Conjunctivae are normal.  Neck: No thyromegaly present.  Cardiovascular: Normal rate, regular rhythm, normal heart sounds and intact distal pulses. Exam reveals no gallop.  No murmur heard. Respiratory: Effort normal and breath sounds normal. No respiratory distress. He has no wheezes. He has no rales.  GI: Soft. There is no abdominal tenderness.  Musculoskeletal:        General: No tenderness or edema.  Lymphadenopathy:    He has no cervical adenopathy.  Neurological: He is alert and oriented to person, place, and time.  Normal sensation in plantar feet  Skin: No rash noted. No erythema.  Psychiatric: He has a normal mood and affect. His behavior is normal.           Assessment  & Plan:

## 2019-09-02 NOTE — Assessment & Plan Note (Signed)
Not really working on lifestyle discussed

## 2019-09-02 NOTE — Assessment & Plan Note (Signed)
Does well with the PPI 

## 2019-09-02 NOTE — Assessment & Plan Note (Signed)
Healthy but needs to work on fitness Had flu vaccine Pneumonia vaccine next year Colon due again 2025 Defer PSA to next year

## 2019-09-02 NOTE — Assessment & Plan Note (Signed)
Ongoing Likely related to prediabetes Will recheck labs

## 2019-09-02 NOTE — Assessment & Plan Note (Signed)
Apparent choroidal fissure cyst Discussed repeat MRI---will hold off since no symptoms

## 2019-09-03 ENCOUNTER — Other Ambulatory Visit: Payer: Self-pay

## 2019-09-03 MED ORDER — SIMVASTATIN 40 MG PO TABS: 40.0000 mg | ORAL_TABLET | Freq: Every day | ORAL | 3 refills | Status: DC

## 2019-09-03 NOTE — Telephone Encounter (Signed)
Spoke to pt. He is willing to start simvastatin. I sent the rx to [facility] at his request.

## 2019-09-03 NOTE — Telephone Encounter (Signed)
Per Dr Silvio Pate: Please call to find out if he is willing to go back on cholesterol med (was on simvastatin 40 in past--we might have tried off to see if it was affecting his memory--but if not, he should restart)  If willing to restart, send Rx

## 2019-09-09 ENCOUNTER — Other Ambulatory Visit: Payer: Self-pay | Admitting: Internal Medicine

## 2019-12-22 ENCOUNTER — Other Ambulatory Visit: Payer: Self-pay

## 2019-12-22 ENCOUNTER — Encounter: Payer: Self-pay | Admitting: Internal Medicine

## 2019-12-22 ENCOUNTER — Ambulatory Visit (INDEPENDENT_AMBULATORY_CARE_PROVIDER_SITE_OTHER): Payer: 59 | Admitting: Internal Medicine

## 2019-12-22 DIAGNOSIS — R519 Headache, unspecified: Secondary | ICD-10-CM | POA: Diagnosis not present

## 2019-12-22 DIAGNOSIS — R5383 Other fatigue: Secondary | ICD-10-CM | POA: Diagnosis not present

## 2019-12-22 NOTE — Patient Instructions (Signed)
General Headache Without Cause A headache is pain or discomfort that is felt around the head or neck area. There are many causes and types of headaches. In some cases, the cause may not be found. Follow these instructions at home: Watch your condition for any changes. Let your doctor know about them. Take these steps to help with your condition: Managing pain      Take over-the-counter and prescription medicines only as told by your doctor.  Lie down in a dark, quiet room when you have a headache.  If told, put ice on your head and neck area: ? Put ice in a plastic bag. ? Place a towel between your skin and the bag. ? Leave the ice on for 20 minutes, 2-3 times per day.  If told, put heat on the affected area. Use the heat source that your doctor recommends, such as a moist heat pack or a heating pad. ? Place a towel between your skin and the heat source. ? Leave the heat on for 20-30 minutes. ? Remove the heat if your skin turns bright red. This is very important if you are unable to feel pain, heat, or cold. You may have a greater risk of getting burned.  Keep lights dim if bright lights bother you or make your headaches worse. Eating and drinking  Eat meals on a regular schedule.  If you drink alcohol: ? Limit how much you use to:  0-1 drink a day for women.  0-2 drinks a day for men. ? Be aware of how much alcohol is in your drink. In the [place] one drink equals one 12 oz bottle of beer (355 mL), one 5 oz glass of wine (148 mL), or one 1 oz glass of hard liquor (44 mL).  Stop drinking caffeine, or reduce how much caffeine you drink. General instructions   Keep a journal to find out if certain things bring on headaches. For example, write down: ? What you eat and drink. ? How much sleep you get. ? Any change to your diet or medicines.  Get a massage or try other ways to relax.  Limit stress.  Sit up straight. Do not tighten (tense) your muscles.  Do not use any  products that contain nicotine or tobacco. This includes cigarettes, e-cigarettes, and chewing tobacco. If you need help quitting, ask your doctor.  Exercise regularly as told by your doctor.  Get enough sleep. This often means 7-9 hours of sleep each night.  Keep all follow-up visits as told by your doctor. This is important. Contact a doctor if:  Your symptoms are not helped by medicine.  You have a headache that feels different than the other headaches.  You feel sick to your stomach (nauseous) or you throw up (vomit).  You have a fever. Get help right away if:  Your headache gets very bad quickly.  Your headache gets worse after a lot of physical activity.  You keep throwing up.  You have a stiff neck.  You have trouble seeing.  You have trouble speaking.  You have pain in the eye or ear.  Your muscles are weak or you lose muscle control.  You lose your balance or have trouble walking.  You feel like you will pass out (faint) or you pass out.  You are mixed up (confused).  You have a seizure. Summary  A headache is pain or discomfort that is felt around the head or neck area.  There are many causes and   types of headaches. In some cases, the cause may not be found.  Keep a journal to help find out what causes your headaches. Watch your condition for any changes. Let your doctor know about them.  Contact a doctor if you have a headache that is different from usual, or if your headache is not helped by medicine.  Get help right away if your headache gets very bad, you throw up, you have trouble seeing, you lose your balance, or you have a seizure. This information is not intended to replace advice given to you by your health care provider. Make sure you discuss any questions you have with your health care provider. Document Revised: 04/01/2018 Document Reviewed: 04/01/2018 Elsevier Patient Education  2020 Elsevier Inc.  

## 2019-12-22 NOTE — Progress Notes (Signed)
Virtual Visit via Video Note  I connected with [place] on 12/22/19 at  2:15 PM EDT by a video enabled telemedicine application and verified that I am speaking with the correct person using two identifiers.  Location: Patient: Home Provider: Office   I discussed the limitations of evaluation and management by telemedicine and the availability of in person appointments. The patient expressed understanding and agreed to proceed.  History of Present Illness:  Pt reports fatigue and headaches. This started 2-3 weeks ago. The pain is located in his left temple. He describes the pain as sharp. The pain does not radiate. He denies dizziness, visual changes, sensitivity to light or sound, nausea or vomiting. He denies runny nose, nasal congestion, ear pain, sore throat, loss of taste or smell. He denies fever, chills or body aches. He has some left sided neck pain upon wakening but reports this resolves quickly with stretching. He denies recent increase in stress. He took Ibuprofen OTC today with complete resolution of headache. He has no history of HTN and has not been monitoring his blood pressure at home. He has not had an eye exam in the last 1-2 years, does wear readers.  Past Medical History:  Diagnosis Date  . Anxiety   . Depression   . ED (erectile dysfunction)   . Gastritis    mild   . GERD (gastroesophageal reflux disease)   . Heart murmur   . Hiatal hernia 09-28-2005  . Hyperlipidemia   . IBS (irritable bowel syndrome)     Current Outpatient Medications  Medication Sig Dispense Refill  . metFORMIN (GLUCOPHAGE-XR) 500 MG 24 hr tablet TAKE 1 TABLET BY MOUTH ONCE DAILY WITH BREAKFAST 90 tablet 3  . omeprazole (PRILOSEC) 20 MG capsule TAKE 1 CAPSULE BY MOUTH EVERY DAY 90 capsule 3  . simvastatin (ZOCOR) 40 MG tablet Take 1 tablet (40 mg total) by mouth at bedtime. 90 tablet 3   No current facility-administered medications for this visit.    No Known Allergies  Family History   Problem Relation Age of Onset  . Diabetes Mother   . Heart disease Mother   . Heart disease Father   . Alcohol abuse Father   . Liver disease Other        3 siblings  . Colon cancer Neg Hx   . Prostate cancer Neg Hx   . Esophageal cancer Neg Hx   . Rectal cancer Neg Hx   . Stomach cancer Neg Hx     Social History   Socioeconomic History  . Marital status: Married    Spouse name: Not on file  . Number of children: 2  . Years of education: Not on file  . Highest education level: Not on file  Occupational History  . Occupation: Retired Scientist, research (medical)  . Occupation: Foster care in past  . Occupation: Writing a book  Tobacco Use  . Smoking status: Former Smoker    Types: Cigarettes    Quit date: 09/25/1968    Years since quitting: 51.2  . Smokeless tobacco: Never Used  Substance and Sexual Activity  . Alcohol use: Yes    Comment: 5-6 drinks a week mostly beer  . Drug use: Yes    Types: Marijuana  . Sexual activity: Not on file  Other Topics Concern  . Not on file  Social History Narrative   Married, 2 sons.     Social Determinants of Health   Financial Resource Strain:   . Difficulty of Paying  Living Expenses:   Food Insecurity:   . Worried About Charity fundraiser in the Last Year:   . Arboriculturist in the Last Year:   Transportation Needs:   . Film/video editor (Medical):   Marland Kitchen Lack of Transportation (Non-Medical):   Physical Activity:   . Days of Exercise per Week:   . Minutes of Exercise per Session:   Stress:   . Feeling of Stress :   Social Connections:   . Frequency of Communication with Friends and Family:   . Frequency of Social Gatherings with Friends and Family:   . Attends Religious Services:   . Active Member of Clubs or Organizations:   . Attends Archivist Meetings:   Marland Kitchen Marital Status:   Intimate Partner Violence:   . Fear of Current or Ex-Partner:   . Emotionally Abused:   Marland Kitchen Physically Abused:   . Sexually Abused:       Constitutional: Pt reports fatigue, headache. Denies fever, malaise, or abrupt weight changes.  HEENT: Denies eye pain, eye redness, ear pain, ringing in the ears, wax buildup, runny nose, nasal congestion, bloody nose, or sore throat. Respiratory: Denies difficulty breathing, shortness of breath, cough or sputum production.   Cardiovascular: Denies chest pain, chest tightness, palpitations or swelling in the hands or feet.  Musculoskeletal: Denies decrease in range of motion, difficulty with gait, muscle pain or joint pain and swelling.  Skin: Denies redness, rashes, lesions or ulcercations.  Neurological: Denies dizziness, difficulty with memory, difficulty with speech or problems with balance and coordination.   No other specific complaints in a complete review of systems (except as listed in HPI above).  Observations/Objective:  There were no vitals taken for this visit.  Wt Readings from Last 3 Encounters:  09/02/19 222 lb (100.7 kg)  06/17/19 225 lb (102.1 kg)  10/01/18 229 lb (103.9 kg)    General: Appears his stated age, obese, in NAD. HEENT: Head: normal shape and size; Eyes: sclera white, and EOMs intact;  Pulmonary/Chest: Normal effort. No respiratory distress.  Neurological: Alert and oriented. . Coordination normal.    BMET    Component Value Date/Time   NA 139 09/02/2019 0941   K 4.1 09/02/2019 0941   CL 104 09/02/2019 0941   CO2 27 09/02/2019 0941   GLUCOSE 108 (H) 09/02/2019 0941   BUN 11 09/02/2019 0941   CREATININE 0.97 09/02/2019 0941   CALCIUM 9.3 09/02/2019 0941   GFRNONAA 120.37 05/05/2010 1615   GFRAA 114 07/28/2008 0923    Lipid Panel     Component Value Date/Time   CHOL 256 (H) 09/02/2019 0941   TRIG 136.0 09/02/2019 0941   HDL 48.60 09/02/2019 0941   CHOLHDL 5 09/02/2019 0941   VLDL 27.2 09/02/2019 0941   LDLCALC 180 (H) 09/02/2019 0941    CBC    Component Value Date/Time   WBC 7.2 09/02/2019 0941   RBC 5.29 09/02/2019 0941    HGB 15.0 09/02/2019 0941   HCT 46.3 09/02/2019 0941   PLT 217.0 09/02/2019 0941   MCV 87.4 09/02/2019 0941   MCHC 32.4 09/02/2019 0941   RDW 13.4 09/02/2019 0941   LYMPHSABS 3.4 07/20/2015 1251   MONOABS 0.7 07/20/2015 1251   EOSABS 0.2 07/20/2015 1251   BASOSABS 0.1 07/20/2015 1251    Hgb A1C Lab Results  Component Value Date   HGBA1C 6.8 (H) 09/02/2019        Assessment and Plan:  Fatigue, Headache:  Headache resolved- continue  Ibuprofen as needed Encouraged adequate water intake  Discussed COVID, but this has been going on > 14 days, testing not really implicated at this time.  RTC as needed or if symptoms persist or worsen  Follow Up Instructions:    I discussed the assessment and treatment plan with the patient. The patient was provided an opportunity to ask questions and all were answered. The patient agreed with the plan and demonstrated an understanding of the instructions.   The patient was advised to call back or seek an in-person evaluation if the symptoms worsen or if the condition fails to improve as anticipated.    Webb Silversmith, NP

## 2020-08-30 ENCOUNTER — Telehealth: Payer: Self-pay | Admitting: Internal Medicine

## 2020-08-30 NOTE — Telephone Encounter (Signed)
Not sure I follow. His appt is on Friday. Is he asking to be seen before Friday?

## 2020-08-30 NOTE — Telephone Encounter (Signed)
We do not have anything. He may want to reschedule. You can put him anywhere there is a open spot with a same day next to if needed.

## 2020-08-30 NOTE — Telephone Encounter (Signed)
Pt wife called in wanted to know if he can be seen earlier due to his wife is retiring

## 2020-08-30 NOTE — Telephone Encounter (Signed)
EARLIER THAT DAY DUE TO SHE IS RETIRING AT 11AM THAT DAY

## 2020-09-03 ENCOUNTER — Encounter: Payer: 59 | Admitting: Internal Medicine

## 2020-09-25 ENCOUNTER — Other Ambulatory Visit: Payer: Self-pay | Admitting: Internal Medicine

## 2020-10-14 ENCOUNTER — Other Ambulatory Visit: Payer: Self-pay | Admitting: Internal Medicine

## 2020-10-27 ENCOUNTER — Other Ambulatory Visit: Payer: Self-pay | Admitting: Internal Medicine

## 2020-11-05 ENCOUNTER — Other Ambulatory Visit: Payer: Self-pay

## 2020-11-05 MED ORDER — SIMVASTATIN 40 MG PO TABS: 40.0000 mg | ORAL_TABLET | Freq: Every day | ORAL | 0 refills | Status: DC

## 2020-11-05 MED ORDER — METFORMIN HCL ER 500 MG PO TB24: 500.0000 mg | ORAL_TABLET | Freq: Every day | ORAL | 0 refills | Status: DC

## 2020-11-23 ENCOUNTER — Other Ambulatory Visit: Payer: Self-pay

## 2020-11-23 ENCOUNTER — Ambulatory Visit (INDEPENDENT_AMBULATORY_CARE_PROVIDER_SITE_OTHER): Payer: 59 | Admitting: Internal Medicine

## 2020-11-23 ENCOUNTER — Encounter: Payer: Self-pay | Admitting: Internal Medicine

## 2020-11-23 VITALS — BP 136/94 | HR 63 | Temp 97.8°F | Ht 72.5 in | Wt 228.0 lb

## 2020-11-23 DIAGNOSIS — R413 Other amnesia: Secondary | ICD-10-CM | POA: Diagnosis not present

## 2020-11-23 DIAGNOSIS — E785 Hyperlipidemia, unspecified: Secondary | ICD-10-CM

## 2020-11-23 DIAGNOSIS — Z125 Encounter for screening for malignant neoplasm of prostate: Secondary | ICD-10-CM | POA: Diagnosis not present

## 2020-11-23 DIAGNOSIS — R200 Anesthesia of skin: Secondary | ICD-10-CM

## 2020-11-23 DIAGNOSIS — Z23 Encounter for immunization: Secondary | ICD-10-CM | POA: Diagnosis not present

## 2020-11-23 DIAGNOSIS — Z0000 Encounter for general adult medical examination without abnormal findings: Secondary | ICD-10-CM | POA: Diagnosis not present

## 2020-11-23 DIAGNOSIS — K219 Gastro-esophageal reflux disease without esophagitis: Secondary | ICD-10-CM

## 2020-11-23 DIAGNOSIS — Z7189 Other specified counseling: Secondary | ICD-10-CM

## 2020-11-23 DIAGNOSIS — R7303 Prediabetes: Secondary | ICD-10-CM

## 2020-11-23 LAB — CBC
HCT: 46.1 % (ref 39.0–52.0)
Hemoglobin: 15.3 g/dL (ref 13.0–17.0)
MCHC: 33.1 g/dL (ref 30.0–36.0)
MCV: 86.3 fl (ref 78.0–100.0)
Platelets: 196 10*3/uL (ref 150.0–400.0)
RBC: 5.35 Mil/uL (ref 4.22–5.81)
RDW: 13.6 % (ref 11.5–15.5)
WBC: 7.9 10*3/uL (ref 4.0–10.5)

## 2020-11-23 LAB — LIPID PANEL
Cholesterol: 157 mg/dL (ref 0–200)
HDL: 51.5 mg/dL (ref >39.00)
LDL Cholesterol: 83 mg/dL (ref 0–99)
NonHDL: 105.4
Total CHOL/HDL Ratio: 3
Triglycerides: 111 mg/dL (ref 0.0–149.0)
VLDL: 22.2 mg/dL (ref 0.0–40.0)

## 2020-11-23 LAB — HEMOGLOBIN A1C: Hgb A1c MFr Bld: 7.1 % — ABNORMAL HIGH (ref 4.6–6.5)

## 2020-11-23 LAB — COMPREHENSIVE METABOLIC PANEL
ALT: 24 U/L (ref 0–53)
AST: 19 U/L (ref 0–37)
Albumin: 4.5 g/dL (ref 3.5–5.2)
Alkaline Phosphatase: 73 U/L (ref 39–117)
BUN: 10 mg/dL (ref 6–23)
CO2: 28 mEq/L (ref 19–32)
Calcium: 9.4 mg/dL (ref 8.4–10.5)
Chloride: 104 mEq/L (ref 96–112)
Creatinine, Ser: 0.9 mg/dL (ref 0.40–1.50)
GFR: 89.63 mL/min (ref >60.00)
Glucose, Bld: 103 mg/dL — ABNORMAL HIGH (ref 70–99)
Potassium: 4.2 mEq/L (ref 3.5–5.1)
Sodium: 140 mEq/L (ref 135–145)
Total Bilirubin: 0.6 mg/dL (ref 0.2–1.2)
Total Protein: 7.7 g/dL (ref 6.0–8.3)

## 2020-11-23 LAB — T4, FREE: Free T4: 0.67 ng/dL (ref 0.60–1.60)

## 2020-11-23 LAB — VITAMIN B12: Vitamin B-12: 408 pg/mL (ref 211–911)

## 2020-11-23 LAB — PSA, MEDICARE: PSA: 0.35 ng/ml (ref 0.10–4.00)

## 2020-11-23 NOTE — Assessment & Plan Note (Signed)
Mild and stable Will try off the statin for 1-2 months to see if that has any effect

## 2020-11-23 NOTE — Progress Notes (Signed)
Hearing Screening   125Hz  250Hz  500Hz  1000Hz  2000Hz  3000Hz  4000Hz  6000Hz  8000Hz   Right ear:   20 20 20  20     Left ear:           Comments: Deaf in left ear   Visual Acuity Screening   Right eye Left eye Both eyes  Without correction: 20/40 20/50 20/30   With correction:

## 2020-11-23 NOTE — Patient Instructions (Addendum)
Please try off the simvastatin for 1-2 months to see if it helps your memory. If your memory seems better, stay off it and let me know. Otherwise, just restart it!   DASH Eating Plan DASH stands for Dietary Approaches to Stop Hypertension. The DASH eating plan is a healthy eating plan that has been shown to:  Reduce high blood pressure (hypertension).  Reduce your risk for type 2 diabetes, heart disease, and stroke.  Help with weight loss. What are tips for following this plan? Reading food labels  Check food labels for the amount of salt (sodium) per serving. Choose foods with less than 5 percent of the Daily Value of sodium. Generally, foods with less than 300 milligrams (mg) of sodium per serving fit into this eating plan.  To find whole grains, look for the word "whole" as the first word in the ingredient list. Shopping  Buy products labeled as "low-sodium" or "no salt added."  Buy fresh foods. Avoid canned foods and pre-made or frozen meals. Cooking  Avoid adding salt when cooking. Use salt-free seasonings or herbs instead of table salt or sea salt. Check with your health care provider or pharmacist before using salt substitutes.  Do not fry foods. Cook foods using healthy methods such as baking, boiling, grilling, roasting, and broiling instead.  Cook with heart-healthy oils, such as olive, canola, avocado, soybean, or sunflower oil. Meal planning  Eat a balanced diet that includes: ? 4 or more servings of fruits and 4 or more servings of vegetables each day. Try to fill one-half of your plate with fruits and vegetables. ? 6-8 servings of whole grains each day. ? Less than 6 oz (170 g) of lean meat, poultry, or fish each day. A 3-oz (85-g) serving of meat is about the same size as a deck of cards. One egg equals 1 oz (28 g). ? 2-3 servings of low-fat dairy each day. One serving is 1 cup (237 mL). ? 1 serving of nuts, seeds, or beans 5 times each week. ? 2-3 servings of  heart-healthy fats. Healthy fats called omega-3 fatty acids are found in foods such as walnuts, flaxseeds, fortified milks, and eggs. These fats are also found in cold-water fish, such as sardines, salmon, and mackerel.  Limit how much you eat of: ? Canned or prepackaged foods. ? Food that is high in trans fat, such as some fried foods. ? Food that is high in saturated fat, such as fatty meat. ? Desserts and other sweets, sugary drinks, and other foods with added sugar. ? Full-fat dairy products.  Do not salt foods before eating.  Do not eat more than 4 egg yolks a week.  Try to eat at least 2 vegetarian meals a week.  Eat more home-cooked food and less restaurant, buffet, and fast food.   Lifestyle  When eating at a restaurant, ask that your food be prepared with less salt or no salt, if possible.  If you drink alcohol: ? Limit how much you use to:  0-1 drink a day for women who are not pregnant.  0-2 drinks a day for men. ? Be aware of how much alcohol is in your drink. In the [place] one drink equals one 12 oz bottle of beer (355 mL), one 5 oz glass of wine (148 mL), or one 1 oz glass of hard liquor (44 mL). General information  Avoid eating more than 2,300 mg of salt a day. If you have hypertension, you may need to reduce your  sodium intake to 1,500 mg a day.  Work with your health care provider to maintain a healthy body weight or to lose weight. Ask what an ideal weight is for you.  Get at least 30 minutes of exercise that causes your heart to beat faster (aerobic exercise) most days of the week. Activities may include walking, swimming, or biking.  Work with your health care provider or dietitian to adjust your eating plan to your individual calorie needs. What foods should I eat? Fruits All fresh, dried, or frozen fruit. Canned fruit in natural juice (without added sugar). Vegetables Fresh or frozen vegetables (raw, steamed, roasted, or grilled). Low-sodium or  reduced-sodium tomato and vegetable juice. Low-sodium or reduced-sodium tomato sauce and tomato paste. Low-sodium or reduced-sodium canned vegetables. Grains Whole-grain or whole-wheat bread. Whole-grain or whole-wheat pasta. Brown rice. [place]. Bulgur. Whole-grain and low-sodium cereals. Pita bread. Low-fat, low-sodium crackers. Whole-wheat flour tortillas. Meats and other proteins Skinless chicken or [place]. Ground chicken or [place]. Pork with fat trimmed off. Fish and seafood. Egg whites. Dried beans, peas, or lentils. Unsalted nuts, nut butters, and seeds. Unsalted canned beans. Lean cuts of beef with fat trimmed off. Low-sodium, lean precooked or cured meat, such as sausages or meat loaves. Dairy Low-fat (1%) or fat-free (skim) milk. Reduced-fat, low-fat, or fat-free cheeses. Nonfat, low-sodium ricotta or cottage cheese. Low-fat or nonfat yogurt. Low-fat, low-sodium cheese. Fats and oils Soft margarine without trans fats. Vegetable oil. Reduced-fat, low-fat, or light mayonnaise and salad dressings (reduced-sodium). Canola, safflower, olive, avocado, soybean, and sunflower oils. Avocado. Seasonings and condiments Herbs. Spices. Seasoning mixes without salt. Other foods Unsalted popcorn and pretzels. Fat-free sweets. The items listed above may not be a complete list of foods and beverages you can eat. Contact a dietitian for more information. What foods should I avoid? Fruits Canned fruit in a light or heavy syrup. Fried fruit. Fruit in cream or butter sauce. Vegetables Creamed or fried vegetables. Vegetables in a cheese sauce. Regular canned vegetables (not low-sodium or reduced-sodium). Regular canned tomato sauce and paste (not low-sodium or reduced-sodium). Regular tomato and vegetable juice (not low-sodium or reduced-sodium). Angie Fava. Olives. Grains Baked goods made with fat, such as croissants, muffins, or some breads. Dry pasta or rice meal packs. Meats and other  proteins Fatty cuts of meat. Ribs. Fried meat. Berniece Salines. Bologna, salami, and other precooked or cured meats, such as sausages or meat loaves. Fat from the back of a pig (fatback). Bratwurst. Salted nuts and seeds. Canned beans with added salt. Canned or smoked fish. Whole eggs or egg yolks. Chicken or [place] with skin. Dairy Whole or 2% milk, cream, and half-and-half. Whole or full-fat cream cheese. Whole-fat or sweetened yogurt. Full-fat cheese. Nondairy creamers. Whipped toppings. Processed cheese and cheese spreads. Fats and oils Butter. Stick margarine. Lard. Shortening. Ghee. Bacon fat. Tropical oils, such as coconut, palm kernel, or palm oil. Seasonings and condiments Onion salt, garlic salt, seasoned salt, table salt, and sea salt. Worcestershire sauce. Tartar sauce. Barbecue sauce. Teriyaki sauce. Soy sauce, including reduced-sodium. Steak sauce. Canned and packaged gravies. Fish sauce. Oyster sauce. Cocktail sauce. Store-bought horseradish. Ketchup. Mustard. Meat flavorings and tenderizers. Bouillon cubes. Hot sauces. Pre-made or packaged marinades. Pre-made or packaged taco seasonings. Relishes. Regular salad dressings. Other foods Salted popcorn and pretzels. The items listed above may not be a complete list of foods and beverages you should avoid. Contact a dietitian for more information. Where to find more information  National Heart, Lung, and Blood Institute: https://wilson-eaton.com/  American  Heart Association: www.heart.org  Academy of Nutrition and Dietetics: www.eatright.[place]: www.kidney.org Summary  The DASH eating plan is a healthy eating plan that has been shown to reduce high blood pressure (hypertension). It may also reduce your risk for type 2 diabetes, heart disease, and stroke.  When on the DASH eating plan, aim to eat more fresh fruits and vegetables, whole grains, lean proteins, low-fat dairy, and heart-healthy fats.  With the DASH eating plan,  you should limit salt (sodium) intake to 2,300 mg a day. If you have hypertension, you may need to reduce your sodium intake to 1,500 mg a day.  Work with your health care provider or dietitian to adjust your eating plan to your individual calorie needs. This information is not intended to replace advice given to you by your health care provider. Make sure you discuss any questions you have with your health care provider. Document Revised: 08/15/2019 Document Reviewed: 08/15/2019 Elsevier Patient Education  2021 [employer].

## 2020-11-23 NOTE — Assessment & Plan Note (Signed)
Still fine with primary prevention with simvastatin

## 2020-11-23 NOTE — Assessment & Plan Note (Signed)
May have been related to alcohol--has cut back--but was very focal Will recheck labs

## 2020-11-23 NOTE — Addendum Note (Signed)
Addended by: Pilar Grammes on: 11/23/2020 12:11 PM   Modules accepted: Orders

## 2020-11-23 NOTE — Progress Notes (Signed)
Subjective:    Patient ID: Alejandro Hoover, male    DOB: Mar 22, 1956, 65 y.o.   MRN: [id number]  HPI Here for Welcome to Medicare visit and follow up of chronic health conditions This visit occurred during the SARS-CoV-2 public health emergency.  Safety protocols were in place, including screening questions prior to the visit, additional usage of staff PPE, and extensive cleaning of exam room while observing appropriate contact time as indicated for disinfecting solutions.   Reviewed form and advanced directives Reviewed other doctors Now has 2 beers a day generally No tobacco Vision is okay Deaf in left ear--right is okay No falls No depression or anhedonia Independent with instrumental ADLs Not exercising--discussed  Has had some pain along left foot---for years (shooting) Then pain along ulnar left hand He decreased  alcohol and it improved  Memory is "bad" Always lots on his mind---will forget where he put something or what he was planning Still writing book--making some progress. 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 metFORMIN (GLUCOPHAGE-XR) 500 MG 24 hr tablet Take 1 tablet (500 mg total) by mouth daily with breakfast. 90 tablet 0  . omeprazole (PRILOSEC) 20 MG capsule TAKE 1 CAPSULE BY MOUTH EVERY DAY 90 capsule 0  . simvastatin (ZOCOR) 40 MG tablet Take 1 tablet (40 mg total) by mouth at bedtime. 90 tablet 0   No current facility-administered medications on file prior to visit.    No Known Allergies  Past Medical History:  Diagnosis Date  . Anxiety   . Depression   . ED (erectile dysfunction)   . Gastritis     mild   . GERD (gastroesophageal reflux disease)   . Heart murmur   . Hiatal hernia 09-28-2005  . Hyperlipidemia   . IBS (irritable bowel syndrome)     Past Surgical History:  Procedure Laterality Date  . CARDIOVASCULAR STRESS TEST     neg  . ESOPHAGOGASTRODUODENOSCOPY      Family History  Problem Relation Age of Onset  . Diabetes Mother   . Heart disease Mother   . Heart disease Father   . Alcohol abuse Father   . Liver disease Other        3 siblings  . Colon cancer Neg Hx   . Prostate cancer Neg Hx   . Esophageal cancer Neg Hx   . Rectal cancer Neg Hx   . Stomach cancer Neg Hx     Social History   Socioeconomic History  . Marital status: Married    Spouse name: Not on file  . Number of children: 2  . Years of education: Not on file  . Highest education level: Not on file  Occupational History  . Occupation: Retired Scientist, research (medical)  . Occupation: Foster care in past  . Occupation: Writing a book  Tobacco Use  . Smoking status: Former Smoker    Types: Cigarettes    Quit date: 09/25/1968    Years since quitting: 52.1  . Smokeless tobacco: Never Used  Vaping Use  . Vaping Use: Never used  Substance and Sexual Activity  . Alcohol use: Yes    Comment: 5-6  drinks a week mostly beer  . Drug use: Yes    Types: Marijuana  . Sexual activity: Not on file  Other Topics Concern  . Not on file  Social History Narrative   Married, 2 sons.        No living will   Would want wife to be his decision maker--alternate would be step daughter   Would accept resuscitation   Would accept feeding tube   Social Determinants of Health   Financial Resource Strain: Not on file  Food Insecurity: Not on file  Transportation Needs: Not on file  Physical Activity: Not on file  Stress: Not on file  Social Connections: Not on file  Intimate Partner Violence: Not on file    Review of Systems Appetite is fine Variable sleep---usually 5 hours a night. No daytime  somnolence Wears seat belt Teeth are okay---keeps up with dentist No skin lesion--doesn't see derm Bowels are fine---no blood Voids okay---just some urgency. No incontinence No sig back or joint pains No chest pain or SOB No dizziness or syncope No edema    Objective:   Physical Exam Constitutional:      Appearance: Normal appearance.  HENT:     Mouth/Throat:     Comments: No lesions Eyes:     Conjunctiva/sclera: Conjunctivae normal.     Pupils: Pupils are equal, round, and reactive to light.  Cardiovascular:     Rate and Rhythm: Normal rate and regular rhythm.     Pulses: Normal pulses.     Heart sounds: No murmur heard. No gallop.      Comments: occ skips Pulmonary:     Effort: Pulmonary effort is normal.     Breath sounds: Normal breath sounds. No wheezing or rales.  Abdominal:     Palpations: Abdomen is soft.     Tenderness: There is no abdominal tenderness.  Musculoskeletal:     Cervical back: Neck supple.     Right lower leg: No edema.     Left lower leg: No edema.  Lymphadenopathy:     Cervical: No cervical adenopathy.  Skin:    General: Skin is warm.     Findings: No rash.  Neurological:     Mental Status: He is alert.     Comments: President---"Biden, Trump, Obama" [id number] Recall 3/3  Psychiatric:        Mood and Affect: Mood normal.        Behavior: Behavior normal.            Assessment & Plan:

## 2020-11-23 NOTE — Assessment & Plan Note (Signed)
DASH eating plan Needs to exercise Is on the metformin

## 2020-11-23 NOTE — Assessment & Plan Note (Addendum)
I have personally reviewed the Medicare Annual Wellness questionnaire and have noted 1. The patient's medical and social history 2. Their use of alcohol, tobacco or illicit drugs 3. Their current medications and supplements 4. The patient's functional ability including ADL's, fall risks, home safety risks and hearing or visual             impairment. 5. Diet and physical activities 6. Evidence for depression or mood disorders  The patients weight, height, BMI and visual acuity have been recorded in the chart I have made referrals, counseling and provided education to the patient based review of the above and I have provided the pt with a written personalized care plan for preventive services.  I have provided you with a copy of your personalized plan for preventive services. Please take the time to review along with your updated medication list.  Had COVID vaccines  Flu vaccine in the fall Prevnar today---pneumovax next year Colon due 2025 Will check PSA Must start exercising  Has EKG on file

## 2020-11-23 NOTE — Assessment & Plan Note (Signed)
See social history 

## 2020-11-23 NOTE — Assessment & Plan Note (Signed)
Controlled with omeprazole Will continue daily

## 2021-01-03 ENCOUNTER — Other Ambulatory Visit: Payer: Self-pay | Admitting: Internal Medicine

## 2021-02-11 ENCOUNTER — Other Ambulatory Visit: Payer: Self-pay | Admitting: Internal Medicine

## 2021-03-05 ENCOUNTER — Other Ambulatory Visit: Payer: Self-pay | Admitting: Internal Medicine

## 2021-11-01 ENCOUNTER — Ambulatory Visit: Payer: 59 | Admitting: Internal Medicine

## 2021-11-01 ENCOUNTER — Encounter: Payer: Self-pay | Admitting: Internal Medicine

## 2021-11-01 ENCOUNTER — Other Ambulatory Visit: Payer: Self-pay

## 2021-11-01 DIAGNOSIS — L089 Local infection of the skin and subcutaneous tissue, unspecified: Secondary | ICD-10-CM | POA: Diagnosis not present

## 2021-11-01 DIAGNOSIS — L723 Sebaceous cyst: Secondary | ICD-10-CM

## 2021-11-01 MED ORDER — CEPHALEXIN 500 MG PO CAPS: 500.0000 mg | ORAL_CAPSULE | Freq: Three times a day (TID) | ORAL | 1 refills | Status: DC

## 2021-11-01 NOTE — Assessment & Plan Note (Signed)
Discussed hot compresses in hope of achieving drainage No fluctuance so wouldn't try I&D Will give cephalexin 500 tid ---if not better in 2 days, will change to doxy 100 bid

## 2021-11-01 NOTE — Progress Notes (Signed)
Subjective:    Patient ID: Alejandro Hoover, male    DOB: 08/12/1956, 66 y.o.   MRN: [id number]  HPI Here due to a skin lesion on his chest  Having pain from mass in chest---started last week Known cyst for years Constant pain--can be severe ("like a boil ready to come to a head") No fever No drainage  Crawling sensation on his right side of neck  Current Outpatient Medications on File Prior to Visit  Medication Sig Dispense Refill   metFORMIN (GLUCOPHAGE-XR) 500 MG 24 hr tablet TAKE 1 TABLET BY MOUTH EVERY DAY WITH BREAKFAST 90 tablet 3   omeprazole (PRILOSEC) 20 MG capsule TAKE 1 CAPSULE BY MOUTH EVERY DAY 90 capsule 3   No current facility-administered medications on file prior to visit.    No Known Allergies  Past Medical History:  Diagnosis Date   Anxiety    Depression    ED (erectile dysfunction)    Gastritis    mild    GERD (gastroesophageal reflux disease)    Heart murmur    Hiatal hernia 09-28-2005   Hyperlipidemia    IBS (irritable bowel syndrome)     Past Surgical History:  Procedure Laterality Date   CARDIOVASCULAR STRESS TEST     neg   ESOPHAGOGASTRODUODENOSCOPY      Family History  Problem Relation Age of Onset   Diabetes Mother    Heart disease Mother    Heart disease Father    Alcohol abuse Father    Liver disease Other        3 siblings   Colon cancer Neg Hx    Prostate cancer Neg Hx    Esophageal cancer Neg Hx    Rectal cancer Neg Hx    Stomach cancer Neg Hx     Social History   Socioeconomic History   Marital status: Married    Spouse name: Not on file   Number of children: 2   Years of education: Not on file   Highest education level: Not on file  Occupational History   Occupation: Retired Scientist, research (medical)   Occupation: Foster care in past   Occupation: Writing a book  Tobacco Use   Smoking status: Former    Types: Cigarettes    Quit date: 09/25/1968    Years since quitting: 53.1   Smokeless tobacco: Never  Vaping Use    Vaping Use: Never used  Substance and Sexual Activity   Alcohol use: Yes    Comment: 5-6 drinks a week mostly beer   Drug use: Yes    Types: Marijuana   Sexual activity: Not on file  Other Topics Concern   Not on file  Social History Narrative   Married, 2 sons.        No living will   Would want wife to be his decision maker--alternate would be step daughter   Would accept resuscitation   Would accept feeding tube   Social Determinants of Health   Financial Resource Strain: Not on file  Food Insecurity: Not on file  Transportation Needs: Not on file  Physical Activity: Not on file  Stress: Not on file  Social Connections: Not on file  Intimate Partner Violence: Not on file   Review of Systems Has cramp at times in upper left chest---better if he sits still This isn't new     Objective:   Physical Exam Constitutional:      Appearance: Normal appearance.  Neck:     Comments: No mass  or tenderness Musculoskeletal:     Cervical back: Neck supple.  Lymphadenopathy:     Cervical: No cervical adenopathy.  Skin:    Comments: Large inflamed cyst (~5cm across) over lower sternum Marked inflammation and induration No clear fluctuance  Neurological:     Mental Status: He is alert.           Assessment & Plan:

## 2021-11-08 ENCOUNTER — Telehealth: Payer: Self-pay | Admitting: Internal Medicine

## 2021-11-08 NOTE — Telephone Encounter (Signed)
Pt wife called she stated that the cephALEXin (KEFLEX) 500 MG capsule hasn't helped him, she wouldn't go into detail only wanted to talk to [place]

## 2021-11-08 NOTE — Telephone Encounter (Signed)
Spoke to pt's wife. She said he is using warm compresses. If it does not come to a head or look to be improving, he will come in.

## 2021-11-08 NOTE — Telephone Encounter (Signed)
Spoke to pt's wife. Says the Keflex is not helping. Draining but has not come to a head. Says its "ugly". Really red.

## 2021-11-11 MED ORDER — DOXYCYCLINE HYCLATE 100 MG PO TABS: 100.0000 mg | ORAL_TABLET | Freq: Two times a day (BID) | ORAL | 1 refills | Status: DC

## 2021-11-11 NOTE — Addendum Note (Signed)
Addended by: Pilar Grammes on: 11/11/2021 08:29 AM   Modules accepted: Orders

## 2021-11-11 NOTE — Telephone Encounter (Signed)
I did not send it in. My apologies. I have sent it now.

## 2021-11-11 NOTE — Telephone Encounter (Signed)
[place] [facility] - Client TELEPHONE ADVICE RECORD [facility] Patient Name: Alejandro Hoover Gender: Male DOB: [birth year] Age: 66 Y 68 M 18 D Return Phone Number: [phone] (Primary) Address: City/ State/ Zip: [place] [address] Client [place] [facility] - Client Client Site [place] Provider Viviana Simpler- MD Contact Type Call Who Is Calling Patient / Member / Family / Caregiver Call Type Triage / Clinical Caller Name Suhaib Guzzo Relationship To Patient Spouse Return Phone Number [phone] (Primary) Chief Complaint Sores Reason for Call Medication Question / Request Initial Comment Caller states her husband was supposed to have pain medication [facility] at [phone], however [facility] not [facility]. The pt has a boil on his chest that is very painful. Translation No Nurse Assessment Nurse: Owens Shark, RN, Noah Delaine Date/Time (Eastern Time): 11/10/2021 6:50:41 PM Confirm and document reason for call. If symptomatic, describe symptoms. ---Caller states husband has a boil and was prescribed doxycycline but pharmacy doesn't have it. Denies fever Does the patient have any new or worsening symptoms? ---Yes Will a triage be completed? ---Yes Related visit to physician within the last 2 weeks? ---Yes Does the PT have any chronic conditions? (i.e. diabetes, asthma, this includes High risk factors for pregnancy, etc.) ---No Is this a behavioral health or substance abuse call? ---No Nurse: Owens Shark, RN, Noah Delaine Date/Time (Eastern Time): 11/10/2021 6:55:41 PM Please select the assessment type ---RX called in but not at pharm Document the name of the medication. ---doxycycline Pharmacy name and phone number. ---[facility] [phone] Has the office closed within the last 30 minutes? ---No Does the client directives allow for assistance with medications after hours? ---Yes Is  there an on-call physician for the client? ---Yes PLEASE NOTE: All timestamps contained within this report are represented as [place] Standard Time. CONFIDENTIALTY NOTICE: This fax transmission is intended only for the addressee. It contains information that is legally privileged, confidential or otherwise protected from use or disclosure. If you are not the intended recipient, you are strictly prohibited from reviewing, disclosing, copying using or disseminating any of this information or taking any action in reliance on or regarding this information. If you have received this fax in error, please notify us immediately by telephone so that we can arrange for its return to [place]. Phone: [phone], Toll-Free: [phone], Fax: [phone] Page: 2 of 2 Call Id: [id number] Guidelines Guideline Title Affirmed Question Affirmed Notes Nurse Date/Time Eilene Ghazi Time) Boil (Skin Abscess) [1] Boil > 1/2 inch across (> 12 mm; larger than a marble) AND [2] center is soft or pus colored Owens Shark, RN, Noah Delaine 11/10/2021 6:52:16 PM Disp. Time Eilene Ghazi Time) Disposition Final User 11/10/2021 7:00:00 PM Pharmacy Call Owens Shark, RN, Noah Delaine Reason: alex[facility] 11/10/2021 6:55:15 PM See PCP within 24 Hours Yes Owens Shark, RN, Lenice Llamas Disagree/Comply Comply Caller Understands Yes PreDisposition Did not know what to do Care Advice Given Per Guideline SEE PCP WITHIN 24 HOURS: * IF OFFICE WILL BE OPEN: You need to be examined within the next 24 hours. Call your doctor (or NP/PA) when the office opens and make an appointment. * Apply an over-the-counter antibiotic ointment (e.g., Bacitracin) to the area of redness. TREATMENT - APPLY ANTIBIOTIC OINTMENT: * Do this three times daily. * For pain relief, you can take either acetaminophen, ibuprofen, or naproxen. PAIN MEDICINES: CALL BACK IF: * You become worse CARE ADVICE per Boil (Skin Abscess) (  Adult)  guideline. Referrals REFERRED TO PCP OFFIC

## 2021-11-11 NOTE — Telephone Encounter (Signed)
Per Dr Alla German note did the doxycycline get sent to pharmacy.

## 2021-11-30 ENCOUNTER — Encounter: Payer: 59 | Admitting: Internal Medicine

## 2022-01-03 ENCOUNTER — Other Ambulatory Visit: Payer: Self-pay | Admitting: Internal Medicine

## 2022-01-05 ENCOUNTER — Ambulatory Visit (INDEPENDENT_AMBULATORY_CARE_PROVIDER_SITE_OTHER): Payer: 59 | Admitting: Internal Medicine

## 2022-01-05 ENCOUNTER — Encounter: Payer: Self-pay | Admitting: Internal Medicine

## 2022-01-05 VITALS — BP 160/80 | HR 83 | Temp 97.8°F | Ht 72.0 in | Wt 235.2 lb

## 2022-01-05 DIAGNOSIS — E785 Hyperlipidemia, unspecified: Secondary | ICD-10-CM

## 2022-01-05 DIAGNOSIS — I10 Essential (primary) hypertension: Secondary | ICD-10-CM | POA: Diagnosis not present

## 2022-01-05 DIAGNOSIS — Z0000 Encounter for general adult medical examination without abnormal findings: Secondary | ICD-10-CM

## 2022-01-05 DIAGNOSIS — K219 Gastro-esophageal reflux disease without esophagitis: Secondary | ICD-10-CM

## 2022-01-05 DIAGNOSIS — Z0001 Encounter for general adult medical examination with abnormal findings: Secondary | ICD-10-CM | POA: Diagnosis not present

## 2022-01-05 DIAGNOSIS — E119 Type 2 diabetes mellitus without complications: Secondary | ICD-10-CM

## 2022-01-05 DIAGNOSIS — Z23 Encounter for immunization: Secondary | ICD-10-CM | POA: Diagnosis not present

## 2022-01-05 DIAGNOSIS — R7303 Prediabetes: Secondary | ICD-10-CM

## 2022-01-05 DIAGNOSIS — E1159 Type 2 diabetes mellitus with other circulatory complications: Secondary | ICD-10-CM | POA: Insufficient documentation

## 2022-01-05 LAB — POCT GLYCOSYLATED HEMOGLOBIN (HGB A1C): Hemoglobin A1C: 7.6 % — AB (ref 4.0–5.6)

## 2022-01-05 LAB — HM DIABETES FOOT EXAM

## 2022-01-05 MED ORDER — METFORMIN HCL ER 500 MG PO TB24: 1000.0000 mg | ORAL_TABLET | Freq: Every day | ORAL | 3 refills | Status: DC

## 2022-01-05 MED ORDER — LOSARTAN POTASSIUM-HCTZ 50-12.5 MG PO TABS: 1.0000 | ORAL_TABLET | Freq: Every day | ORAL | 3 refills | Status: DC

## 2022-01-05 MED ORDER — ROSUVASTATIN CALCIUM 10 MG PO TABS: 10.0000 mg | ORAL_TABLET | Freq: Every day | ORAL | 3 refills | Status: DC

## 2022-01-05 NOTE — Assessment & Plan Note (Signed)
Sugars are worse despite the metformin ?Lab Results  ?Component Value Date  ? HGBA1C 7.6 (A) 01/05/2022  ? ?Will double the dose (500 bid) ?[facility] center referral ?See back 3 months ?

## 2022-01-05 NOTE — Assessment & Plan Note (Signed)
No change in memory with holding statin ?Will restart low dose rosuvastatin ?

## 2022-01-05 NOTE — Progress Notes (Signed)
? ?Subjective:  ? ? Patient ID: Alejandro Hoover, male    DOB: 16-Feb-1956, 66 y.o.   MRN: [id number] ? ?HPI ?Here for Medicare wellness visit and follow up of chronic health conditions ?Reviewed advanced directives ?Reviewed other doctors--Dr Dodd--dentist ?Vision is okay--no recent eye exam ?Still can't hear out of left ear ?Regularly has 2-3 beers many days ?No tobacco---regular cannabis ?No exercise ?No falls ?No depression or anhedonia ?Independent with instrumental ADLs ? 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 ?Not really watching his eating and not exercising ?Has gained a few pounds ?Ongoing numbness and tingling in feet. Mild burning as well ? ?No chest pain ?No SOB ?No dizziness or syncope ?No edema ?No headaches ? ?Mild memory loss doesn't seem to have worsened ? ?Current Outpatient Medications on File Prior to Visit  ?Medication Sig Dispense Refill  ? metFORMIN (GLUCOPHAGE-XR) 500 MG 24 hr tablet TAKE 1 TABLET BY MOUTH EVERY DAY WITH BREAKFAST 90 tablet 3  ? omeprazole (PRILOSEC) 20 MG capsule TAKE 1 CAPSULE BY MOUTH EVERY DAY 90 capsule 3  ? ?No current facility-administered medications on file prior to visit.  ? ? ?No Known Allergies ? ?Past Medical History:  ?Diagnosis Date  ? Anxiety   ? Depression   ? ED (erectile dysfunction)   ? Gastritis   ? mild   ? GERD (gastroesophageal reflux disease)   ? Heart murmur   ? Hiatal hernia 09-28-2005  ? Hyperlipidemia   ? IBS (irritable bowel syndrome)   ? ? ?Past Surgical History:  ?Procedure Laterality Date  ? CARDIOVASCULAR STRESS TEST    ? neg  ? ESOPHAGOGASTRODUODENOSCOPY    ? ? ?Family History  ?Problem Relation Age of Onset  ? Diabetes Mother   ? Heart disease Mother   ? Heart disease Father   ? Alcohol abuse Father   ? Liver disease Other   ?     3 siblings  ? Colon cancer Neg Hx   ? Prostate cancer Neg Hx   ? Esophageal cancer Neg Hx   ? Rectal cancer Neg Hx   ? Stomach cancer Neg Hx   ? ? ?Social History  ? ?Socioeconomic  History  ? Marital status: Married  ?  Spouse name: Not on file  ? Number of children: 2  ? Years of education: Not on file  ? Highest education level: Not on file  ?Occupational History  ? Occupation: Retired Scientist, research (medical)  ? Occupation: Foster care in past  ? Occupation: Writing a book  ?Tobacco Use  ? Smoking status: Former  ?  Types: Cigarettes  ?  Quit date: 09/25/1968  ?  Years since quitting: 53.3  ? Smokeless tobacco: Never  ?Vaping Use  ? Vaping Use: Never used  ?Substance and Sexual Activity  ? Alcohol use: Yes  ?  Comment: 5-6 drinks a week mostly beer  ? Drug use: Yes  ?  Types: Marijuana  ? Sexual activity: Not on file  ?Other Topics Concern  ? Not on file  ?Social History Narrative  ? Married, 2 sons.    ?   ? No living will  ? Would want wife to be his decision maker--alternate would be step daughter  ? Would accept resuscitation  ? Would accept feeding tube  ? ?Social Determinants of Health  ? ?Financial Resource Strain: Not on file  ?Food Insecurity: Not on file  ?Transportation Needs: Not on file  ?Physical Activity: Not on file  ?Stress:  Not on file  ?Social Connections: Not on file  ?Intimate Partner Violence: Not on file  ? ?Review of Systems ?Appetite is fine ?Sleeps okay---usually only 5-6 hours per night ?Wears seat belt ?Teeth are okay---keeps up with dentist ?No suspicious skin lesions ?Voids with good flow. Nocturia x 2 ?Occasional heartburn---does use the omeprazole nightly and this has helped. No dysphagia ?Bowels move fine---no blood ?Some back pain--if does a lot of lifting. Some stiffness. Uses ibuprofen occasionally ?   ?Objective:  ? Physical Exam ?Constitutional:   ?   Appearance: Normal appearance.  ?Cardiovascular:  ?   Rate and Rhythm: Normal rate and regular rhythm.  ?   Pulses: Normal pulses.  ?   Heart sounds: No murmur heard. ?  No gallop.  ?Pulmonary:  ?   Effort: Pulmonary effort is normal.  ?   Breath sounds: Normal breath sounds. No wheezing or rales.  ?Abdominal:  ?    Palpations: Abdomen is soft.  ?   Tenderness: There is no abdominal tenderness.  ?Musculoskeletal:  ?   Cervical back: Neck supple.  ?   Right lower leg: No edema.  ?   Left lower leg: No edema.  ?Lymphadenopathy:  ?   Cervical: No cervical adenopathy.  ?Skin: ?   Findings: No lesion or rash.  ?   Comments: No foot lesions  ?Neurological:  ?   General: No focal deficit present.  ?   Mental Status: He is alert and oriented to person, place, and time.  ?   Comments: Mini-cog normal ?Fairly normal sensation in feet  ?Psychiatric:     ?   Mood and Affect: Mood normal.     ?   Behavior: Behavior normal.  ?  ? ? ? ? ?   ?Assessment & Plan:  ? ?

## 2022-01-05 NOTE — Assessment & Plan Note (Signed)
I have personally reviewed the Medicare Annual Wellness questionnaire and have noted ?1. The patient's medical and social history ?2. Their use of alcohol, tobacco or illicit drugs ?3. Their current medications and supplements ?4. The patient's functional ability including ADL's, fall risks, home safety risks and hearing or visual ?            impairment. ?5. Diet and physical activities ?6. Evidence for depression or mood disorders ? ?The patients weight, height, BMI and visual acuity have been recorded in the chart ?I have made referrals, counseling and provided education to the patient based review of the above and I have provided the pt with a written personalized care plan for preventive services. ? ?I have provided you with a copy of your personalized plan for preventive services. Please take the time to review along with your updated medication list. ? ?Colon due 2025 ?Defer PSA to next year ?Needs to exercise ?Pneumovax 23 today ?Flu vaccine in the fall and probably COVID booster ?

## 2022-01-05 NOTE — Assessment & Plan Note (Addendum)
BP Readings from Last 3 Encounters:  ?01/05/22 (!) 160/80  ?11/01/21 (!) 188/88  ?11/23/20 (!) 136/94  ? ?Higher when I rechecked ?Will start losartan/HCTZ  50/12.5 mg daily ?

## 2022-01-05 NOTE — Assessment & Plan Note (Signed)
Fair control on omeprazole 20 at bedtime ?

## 2022-01-05 NOTE — Addendum Note (Signed)
Addended by: Loreen Freud on: 01/05/2022 04:19 PM ? ? Modules accepted: Orders ? ?

## 2022-01-05 NOTE — Progress Notes (Signed)
Hearing Screening  ? '250Hz'$  '500Hz'$  '1000Hz'$  '2000Hz'$  '4000Hz'$   ?Right ear 20 25 0 20 25  ?Left ear 0 0 0 0 0  ? ?Vision Screening  ? Right eye Left eye Both eyes  ?Without correction '20/70 20/70 20/40 '$  ?With correction     ?Comments: Pt normally wears glasses for seeing distance but did not have them with him.   ?

## 2022-01-06 LAB — LDL CHOLESTEROL, DIRECT: Direct LDL: 188 mg/dL

## 2022-01-06 LAB — MICROALBUMIN / CREATININE URINE RATIO
Creatinine,U: 283.1 mg/dL
Microalb Creat Ratio: 12.6 mg/g (ref 0.0–30.0)
Microalb, Ur: 35.7 mg/dL — ABNORMAL HIGH (ref 0.0–1.9)

## 2022-01-06 LAB — COMPREHENSIVE METABOLIC PANEL
ALT: 23 U/L (ref 0–53)
AST: 19 U/L (ref 0–37)
Albumin: 4.4 g/dL (ref 3.5–5.2)
Alkaline Phosphatase: 79 U/L (ref 39–117)
BUN: 12 mg/dL (ref 6–23)
CO2: 24 mEq/L (ref 19–32)
Calcium: 9.2 mg/dL (ref 8.4–10.5)
Chloride: 104 mEq/L (ref 96–112)
Creatinine, Ser: 1.03 mg/dL (ref 0.40–1.50)
GFR: 75.64 mL/min (ref >60.00)
Glucose, Bld: 144 mg/dL — ABNORMAL HIGH (ref 70–99)
Potassium: 3.8 mEq/L (ref 3.5–5.1)
Sodium: 137 mEq/L (ref 135–145)
Total Bilirubin: 0.4 mg/dL (ref 0.2–1.2)
Total Protein: 7.5 g/dL (ref 6.0–8.3)

## 2022-01-06 LAB — CBC
HCT: 43.5 % (ref 39.0–52.0)
Hemoglobin: 14.4 g/dL (ref 13.0–17.0)
MCHC: 33.2 g/dL (ref 30.0–36.0)
MCV: 86.1 fl (ref 78.0–100.0)
Platelets: 219 10*3/uL (ref 150.0–400.0)
RBC: 5.06 Mil/uL (ref 4.22–5.81)
RDW: 13.6 % (ref 11.5–15.5)
WBC: 8.7 10*3/uL (ref 4.0–10.5)

## 2022-01-06 LAB — LIPID PANEL
Cholesterol: 256 mg/dL — ABNORMAL HIGH (ref 0–200)
HDL: 42.9 mg/dL (ref >39.00)
NonHDL: 213.25
Total CHOL/HDL Ratio: 6
Triglycerides: 385 mg/dL — ABNORMAL HIGH (ref 0.0–149.0)
VLDL: 77 mg/dL — ABNORMAL HIGH (ref 0.0–40.0)

## 2022-01-06 LAB — T4, FREE: Free T4: 0.77 ng/dL (ref 0.60–1.60)

## 2022-02-04 ENCOUNTER — Other Ambulatory Visit: Payer: Self-pay | Admitting: Internal Medicine

## 2022-04-10 ENCOUNTER — Encounter: Payer: Self-pay | Admitting: Internal Medicine

## 2022-04-10 ENCOUNTER — Ambulatory Visit: Payer: 59 | Admitting: Internal Medicine

## 2022-04-10 VITALS — BP 126/70 | HR 67 | Temp 97.4°F | Ht 72.0 in | Wt 229.0 lb

## 2022-04-10 DIAGNOSIS — L723 Sebaceous cyst: Secondary | ICD-10-CM | POA: Diagnosis not present

## 2022-04-10 DIAGNOSIS — I10 Essential (primary) hypertension: Secondary | ICD-10-CM

## 2022-04-10 DIAGNOSIS — L089 Local infection of the skin and subcutaneous tissue, unspecified: Secondary | ICD-10-CM

## 2022-04-10 DIAGNOSIS — E1359 Other specified diabetes mellitus with other circulatory complications: Secondary | ICD-10-CM | POA: Diagnosis not present

## 2022-04-10 DIAGNOSIS — E119 Type 2 diabetes mellitus without complications: Secondary | ICD-10-CM | POA: Diagnosis not present

## 2022-04-10 LAB — POCT GLYCOSYLATED HEMOGLOBIN (HGB A1C): Hemoglobin A1C: 7.6 % — AB (ref 4.0–5.6)

## 2022-04-10 NOTE — Progress Notes (Signed)
Subjective:    Patient ID: Alejandro Hoover, male    DOB: 11-24-1955, 66 y.o.   MRN: [id number]  HPI Here for follow up of diabetes  Has been dealing with wife's health issues Didn't make it to the diabetes counseling and hasn't been able to change his eating  Appetite is down Weight down 6# since last visit  Still feels a mass under a boil from months ago---on his chest Just feels pressure there  Current Outpatient Medications on File Prior to Visit  Medication Sig Dispense Refill   losartan-hydrochlorothiazide (HYZAAR) 50-12.5 MG tablet Take 1 tablet by mouth daily. 90 tablet 3   metFORMIN (GLUCOPHAGE-XR) 500 MG 24 hr tablet TAKE 1 TABLET BY MOUTH EVERY DAY WITH BREAKFAST 90 tablet 3   omeprazole (PRILOSEC) 20 MG capsule TAKE 1 CAPSULE BY MOUTH EVERY DAY 90 capsule 3   rosuvastatin (CRESTOR) 10 MG tablet Take 1 tablet (10 mg total) by mouth daily. 90 tablet 3   No current facility-administered medications on file prior to visit.    No Known Allergies  Past Medical History:  Diagnosis Date   Anxiety    Depression    ED (erectile dysfunction)    Gastritis    mild    GERD (gastroesophageal reflux disease)    Heart murmur    Hiatal hernia 09-28-2005   Hyperlipidemia    IBS (irritable bowel syndrome)     Past Surgical History:  Procedure Laterality Date   CARDIOVASCULAR STRESS TEST     neg   ESOPHAGOGASTRODUODENOSCOPY      Family History  Problem Relation Age of Onset   Diabetes Mother    Heart disease Mother    Heart disease Father    Alcohol abuse Father    Liver disease Other        3 siblings   Colon cancer Neg Hx    Prostate cancer Neg Hx    Esophageal cancer Neg Hx    Rectal cancer Neg Hx    Stomach cancer Neg Hx     Social History   Socioeconomic History   Marital status: Married    Spouse name: Not on file   Number of children: 2   Years of education: Not on file   Highest education level: Not on file  Occupational History   Occupation:  Retired Scientist, research (medical)   Occupation: Foster care in past   Occupation: Writing a book  Tobacco Use   Smoking status: Former    Types: Cigarettes    Quit date: 09/25/1968    Years since quitting: 53.5    Passive exposure: Never   Smokeless tobacco: Never  Vaping Use   Vaping Use: Never used  Substance and Sexual Activity   Alcohol use: Yes    Comment: 5-6 drinks a week mostly beer   Drug use: Yes    Types: Marijuana   Sexual activity: Not on file  Other Topics Concern   Not on file  Social History Narrative   Married, 2 sons.        No living will   Would want wife to be his decision maker--alternate would be step daughter   Would accept resuscitation   Would accept feeding tube   Social Determinants of Health   Financial Resource Strain: Not on file  Food Insecurity: Not on file  Transportation Needs: Not on file  Physical Activity: Not on file  Stress: Not on file  Social Connections: Not on file  Intimate Partner Violence: Not  on file   Review of Systems Sleeping okay     Objective:   Physical Exam Constitutional:      Appearance: Normal appearance.  Skin:    Comments: ~2 cm of induration with crusted top ---cyst near xiphoid Top removed--some bleeding but no pus   Neurological:     Mental Status: He is alert.            Assessment & Plan:

## 2022-04-10 NOTE — Assessment & Plan Note (Signed)
Lab Results  Component Value Date   HGBA1C 7.6 (A) 04/10/2022   A1c unchanged and not really at goal with recent diagnosis Is on the metformin 1000 Next step probably farxiga/jardiance He would like to reschedule the diabetes counseling and try to get it down without more meds for now

## 2022-04-10 NOTE — Assessment & Plan Note (Signed)
Infection seems better but still open cyst and some bleeding Recommended derm evaluation

## 2022-04-10 NOTE — Assessment & Plan Note (Signed)
BP Readings from Last 3 Encounters:  04/10/22 126/70  01/05/22 (!) 160/80  11/01/21 (!) 188/88   Control is better on losartan/HCTZ 50/12.5

## 2022-05-12 ENCOUNTER — Ambulatory Visit: Payer: 59 | Admitting: *Deleted

## 2022-06-09 ENCOUNTER — Encounter: Payer: Self-pay | Admitting: *Deleted

## 2022-06-09 ENCOUNTER — Encounter: Payer: 59 | Attending: Internal Medicine | Admitting: *Deleted

## 2022-06-09 VITALS — BP 168/90 | Ht 72.0 in | Wt 226.0 lb

## 2022-06-09 DIAGNOSIS — Z713 Dietary counseling and surveillance: Secondary | ICD-10-CM | POA: Diagnosis not present

## 2022-06-09 DIAGNOSIS — E119 Type 2 diabetes mellitus without complications: Secondary | ICD-10-CM

## 2022-06-09 DIAGNOSIS — E1359 Other specified diabetes mellitus with other circulatory complications: Secondary | ICD-10-CM | POA: Insufficient documentation

## 2022-06-09 NOTE — Patient Instructions (Signed)
Check blood sugars before breakfast and 2 hrs after supper 3 x week Bring blood sugar records to the next appointment  Call your doctor for a prescription for:  1. Meter strips (type)  Accu-Chek Guide  checking  3   times per week  2. Lancets (type)  Accu-Chek Softclix  checking  3   times per week  Exercise:  Walk for    15  minutes   3  days a week and gradually increase to 30 minutes 5 x week  Eat 3 meals day,   1-2  snacks a day Space meals 4-6 hours apart Increase water to 5-6 servings/day Avoid sugar sweetened drinks (tea, sports drinks, juices)  Complete 3 Day Food Record and bring to next appt  Make an eye doctor appointment  Return for appointment on:  Friday July 28, 2022 at 8:30 am with Freda Munro (nurse)

## 2022-06-09 NOTE — Progress Notes (Signed)
Diabetes Self-Management Education  Visit Type: First/Initial  Appt. Start Time: 0845 Appt. End Time: 6734  06/09/2022  Mr. [facility], identified by name and date of birth, is a 66 y.o. male with a diagnosis of Diabetes: Type 2.   ASSESSMENT  Blood pressure (!) 168/90, height 6' (1.829 m), weight 226 lb (102.5 kg). Body mass index is 30.65 kg/m.   Diabetes Self-Management Education - 06/09/22 1253       Visit Information   Visit Type First/Initial      Initial Visit   Diabetes Type Type 2    Date Diagnosed 6 months    Are you currently following a meal plan? No    Are you taking your medications as prescribed? Yes      Health Coping   How would you rate your overall health? Fair      Psychosocial Assessment   Patient Belief/Attitude about Diabetes Motivated to manage diabetes   "I'm fine" - pt reports he doesn't want to be like his brother who lost his legs   What is the hardest part about your diabetes right now, causing you the most concern, or is the most worrisome to you about your diabetes?   Making healty food and beverage choices    Self-care barriers None    Self-management support Doctor's office;Family    Patient Concerns Nutrition/Meal planning;Glycemic Control;Monitoring    Special Needs None    Preferred Learning Style Visual    Learning Readiness Ready    How often do you need to have someone help you when you read instructions, pamphlets, or other written materials from your doctor or pharmacy? 1 - Never    What is the last grade level you completed in school? high school      Pre-Education Assessment   Patient understands the diabetes disease and treatment process. Needs Instruction    Patient understands incorporating nutritional management into lifestyle. Needs Instruction    Patient undertands incorporating physical activity into lifestyle. Needs Instruction    Patient understands using medications safely. Needs Instruction    Patient understands  monitoring blood glucose, interpreting and using results Needs Instruction    Patient understands prevention, detection, and treatment of acute complications. Needs Instruction    Patient understands prevention, detection, and treatment of chronic complications. Needs Instruction    Patient understands how to develop strategies to address psychosocial issues. Needs Instruction    Patient understands how to develop strategies to promote health/change behavior. Needs Instruction      Complications   Last HgB A1C per patient/outside source 7.6 %   04/10/2022   How often do you check your blood sugar? 0 times/day (not testing)   Provided Accu-Chek Guide Me meter and instructed on use. BG uponr return demonstration was 138 mg/dL at 9:35 am - 2 hrs pp.   Number of hypoglycemic episodes per month --   Pt denies symptoms of hypoglycemia but reports he waits until he feels hungry before he eats (nausea)   Have you had a dilated eye exam in the past 12 months? No    Have you had a dental exam in the past 12 months? Yes    Are you checking your feet? No      Dietary Intake   Breakfast sandwich with eggs and bacon or sausage; occasional cereal and milk    Snack (morning) 0-1 nuts, fruit (pear, apple, grapes)    Lunch left overs, sometimes fast food like Theatre manager, pork,  fish, occasional beef; potatoes, peas, beans, corn, green beans, rice pasta    Beverage(s) water, fruit juice, sugar sweetened tea, sports drinks      Activity / Exercise   Activity / Exercise Type ADL's      Patient Education   Previous Diabetes Education No    Disease Pathophysiology Definition of diabetes, type 1 and 2, and the diagnosis of diabetes;Factors that contribute to the development of diabetes;Explored patient's options for treatment of their diabetes    Healthy Eating Role of diet in the treatment of diabetes and the relationship between the three main macronutrients and blood glucose level;Food label  reading, portion sizes and measuring food.;Reviewed blood glucose goals for pre and post meals and how to evaluate the patients' food intake on their blood glucose level.;Meal timing in regards to the patients' current diabetes medication.;Effects of alcohol on blood glucose and safety factors with consumption of alcohol.    Being Active Role of exercise on diabetes management, blood pressure control and cardiac health.    Medications Reviewed patients medication for diabetes, action, purpose, timing of dose and side effects.    Monitoring Taught/evaluated SMBG meter.;Purpose and frequency of SMBG.;Taught/discussed recording of test results and interpretation of SMBG.;Identified appropriate SMBG and/or A1C goals.;Yearly dilated eye exam    Acute complications Taught prevention, symptoms, and  treatment of hypoglycemia - the 15 rule.    Chronic complications Relationship between chronic complications and blood glucose control    Diabetes Stress and Support Identified and addressed patients feelings and concerns about diabetes      Individualized Goals (developed by patient)   Reducing Risk Other (comment)   improve blood sugars prevent diabetes complications     Outcomes   Expected Outcomes Demonstrated interest in learning. Expect positive outcomes    Future DMSE 2 months         Individualized Plan for Diabetes Self-Management Training:   Learning Objective:  Patient will have a greater understanding of diabetes self-management. Patient education plan is to attend individual and/or group sessions per assessed needs and concerns.   Plan:   Patient Instructions  Check blood sugars before breakfast and 2 hrs after supper 3 x week Bring blood sugar records to the next appointment  Call your doctor for a prescription for:  1. Meter strips (type)  Accu-Chek Guide  checking  3   times per week  2. Lancets (type)  Accu-Chek Softclix  checking  3   times per week  Exercise:  Walk for    15   minutes   3  days a week and gradually increase to 30 minutes 5 x week  Eat 3 meals day,   1-2  snacks a day Space meals 4-6 hours apart Increase water to 5-6 servings/day Avoid sugar sweetened drinks (tea, sports drinks, juices)  Complete 3 Day Food Record and bring to next appt  Make an eye doctor appointment  Return for appointment on:  Friday July 28, 2022 at 8:30 am with Freda Munro (nurse)   Expected Outcomes:  Demonstrated interest in learning. Expect positive outcomes  Education material provided: General Meal Planning Guidelines Simple Meal Plan Meter = Accu-Chek Guide 3 Day Food Record Symptoms, causes and treatments of Hypoglycemia  If problems or questions, patient to contact team via:  Johny Drilling, RN, [place] [phone]  Future DSME appointment: 2 months July 28, 2022 with this educator

## 2022-07-28 ENCOUNTER — Ambulatory Visit: Payer: 59 | Admitting: *Deleted

## 2022-07-28 ENCOUNTER — Encounter: Payer: Self-pay | Admitting: *Deleted

## 2023-01-08 ENCOUNTER — Encounter: Payer: Self-pay | Admitting: Internal Medicine

## 2023-01-08 ENCOUNTER — Ambulatory Visit (INDEPENDENT_AMBULATORY_CARE_PROVIDER_SITE_OTHER): Payer: 59 | Admitting: Internal Medicine

## 2023-01-08 VITALS — BP 132/68 | HR 60 | Temp 97.7°F | Ht 72.25 in | Wt 223.0 lb

## 2023-01-08 DIAGNOSIS — Z125 Encounter for screening for malignant neoplasm of prostate: Secondary | ICD-10-CM

## 2023-01-08 DIAGNOSIS — K219 Gastro-esophageal reflux disease without esophagitis: Secondary | ICD-10-CM

## 2023-01-08 DIAGNOSIS — Z0000 Encounter for general adult medical examination without abnormal findings: Secondary | ICD-10-CM

## 2023-01-08 DIAGNOSIS — E785 Hyperlipidemia, unspecified: Secondary | ICD-10-CM | POA: Diagnosis not present

## 2023-01-08 DIAGNOSIS — Z7984 Long term (current) use of oral hypoglycemic drugs: Secondary | ICD-10-CM

## 2023-01-08 DIAGNOSIS — I10 Essential (primary) hypertension: Secondary | ICD-10-CM

## 2023-01-08 DIAGNOSIS — E1159 Type 2 diabetes mellitus with other circulatory complications: Secondary | ICD-10-CM | POA: Diagnosis not present

## 2023-01-08 LAB — LIPID PANEL
Cholesterol: 141 mg/dL (ref 0–200)
HDL: 52.1 mg/dL (ref >39.00)
LDL Cholesterol: 59 mg/dL (ref 0–99)
NonHDL: 89.35
Total CHOL/HDL Ratio: 3
Triglycerides: 150 mg/dL — ABNORMAL HIGH (ref 0.0–149.0)
VLDL: 30 mg/dL (ref 0.0–40.0)

## 2023-01-08 LAB — HM DIABETES FOOT EXAM

## 2023-01-08 LAB — CBC
HCT: 44 % (ref 39.0–52.0)
Hemoglobin: 14.4 g/dL (ref 13.0–17.0)
MCHC: 32.6 g/dL (ref 30.0–36.0)
MCV: 86.3 fl (ref 78.0–100.0)
Platelets: 228 10*3/uL (ref 150.0–400.0)
RBC: 5.1 Mil/uL (ref 4.22–5.81)
RDW: 13.6 % (ref 11.5–15.5)
WBC: 7.7 10*3/uL (ref 4.0–10.5)

## 2023-01-08 LAB — MICROALBUMIN / CREATININE URINE RATIO
Creatinine,U: 155.4 mg/dL
Microalb Creat Ratio: 1.7 mg/g (ref 0.0–30.0)
Microalb, Ur: 2.6 mg/dL — ABNORMAL HIGH (ref 0.0–1.9)

## 2023-01-08 LAB — COMPREHENSIVE METABOLIC PANEL
ALT: 17 U/L (ref 0–53)
AST: 16 U/L (ref 0–37)
Albumin: 4.4 g/dL (ref 3.5–5.2)
Alkaline Phosphatase: 60 U/L (ref 39–117)
BUN: 13 mg/dL (ref 6–23)
CO2: 27 mEq/L (ref 19–32)
Calcium: 9.2 mg/dL (ref 8.4–10.5)
Chloride: 102 mEq/L (ref 96–112)
Creatinine, Ser: 0.99 mg/dL (ref 0.40–1.50)
GFR: 78.76 mL/min (ref >60.00)
Glucose, Bld: 109 mg/dL — ABNORMAL HIGH (ref 70–99)
Potassium: 4.3 mEq/L (ref 3.5–5.1)
Sodium: 140 mEq/L (ref 135–145)
Total Bilirubin: 0.6 mg/dL (ref 0.2–1.2)
Total Protein: 7.4 g/dL (ref 6.0–8.3)

## 2023-01-08 LAB — PSA, MEDICARE: PSA: 0.49 ng/ml (ref 0.10–4.00)

## 2023-01-08 LAB — HEMOGLOBIN A1C: Hgb A1c MFr Bld: 7.1 % — ABNORMAL HIGH (ref 4.6–6.5)

## 2023-01-08 NOTE — Assessment & Plan Note (Signed)
No problems on rosuvastatin 10 daily

## 2023-01-08 NOTE — Assessment & Plan Note (Signed)
Not really too compliant with lifestyle On metformin 1000mg  daily If A1c is worse--will add jardiance/farxiga

## 2023-01-08 NOTE — Assessment & Plan Note (Signed)
I have personally reviewed the Medicare Annual Wellness questionnaire and have noted 1. The patient's medical and social history 2. Their use of alcohol, tobacco or illicit drugs 3. Their current medications and supplements 4. The patient's functional ability including ADL's, fall risks, home safety risks and hearing or visual             impairment. 5. Diet and physical activities 6. Evidence for depression or mood disorders  The patients weight, height, BMI and visual acuity have been recorded in the chart I have made referrals, counseling and provided education to the patient based review of the above and I have provided the pt with a written personalized care plan for preventive services.  I have provided you with a copy of your personalized plan for preventive services. Please take the time to review along with your updated medication list.  Colonoscopy due next year Will check PSA Discussed trying to exercise--challenging with wife's needs Updated COVID vaccine soon FLu/RSV vaccines in the fall

## 2023-01-08 NOTE — Assessment & Plan Note (Addendum)
Quiet on the omeprazole 20mg  daily Discussed skipping doses---like get to every other night if possible

## 2023-01-08 NOTE — Progress Notes (Signed)
Vision Screening   Right eye Left eye Both eyes  Without correction 20/70 20/50 20/50   With correction     Hearing Screening - Comments:: Passed whisper test

## 2023-01-08 NOTE — Progress Notes (Signed)
Subjective:    Patient ID: Alejandro Hoover, male    DOB: 08-12-1956, 67 y.o.   MRN: [id number]  HPI Here for Medicare wellness visit and follow up of chronic health conditions Reviewed advanced directives Reviewed other doctors--Dr Dodd---dentist No hospitalizations or surgery in the past year Vision is okay--hasn't seen eye doctor Hearing is same--no hearing on left, right is okay Occasional beer No tobacco Not exercising No falls No depression or anhedonia He does all the instrumental ADLs No sig memory problems  He did go to one diabetes counseling session Still working on eating better--but stress with wife's condition (she has a lot of problems eating and he is concentrating on this) Has checked sugars occasionally--doesn't remember what they are Does get sharp itch (?burning) in foot--if excited. No numbness  Not exercising Has lost some weight from last year---not eating as much   No chest pain No SOB---just feels fatigued No palpitations No dizziness or syncope No edema No headaches  No heartburn on omeprazole No dysphagia  Current Outpatient Medications on File Prior to Visit  Medication Sig Dispense Refill   losartan-hydrochlorothiazide (HYZAAR) 50-12.5 MG tablet Take 1 tablet by mouth daily. 90 tablet 3   metFORMIN (GLUCOPHAGE-XR) 500 MG 24 hr tablet TAKE 1 TABLET BY MOUTH EVERY DAY WITH BREAKFAST 90 tablet 3   omeprazole (PRILOSEC) 20 MG capsule TAKE 1 CAPSULE BY MOUTH EVERY DAY 90 capsule 3   rosuvastatin (CRESTOR) 10 MG tablet Take 1 tablet (10 mg total) by mouth daily. 90 tablet 3   No current facility-administered medications on file prior to visit.    No Known Allergies  Past Medical History:  Diagnosis Date   Anxiety    Depression    Diabetes mellitus without complication    ED (erectile dysfunction)    Gastritis    mild    GERD (gastroesophageal reflux disease)    Heart murmur    Hiatal hernia 09/28/2005   Hyperlipidemia    Hypertension     IBS (irritable bowel syndrome)     Past Surgical History:  Procedure Laterality Date   CARDIOVASCULAR STRESS TEST     neg   ESOPHAGOGASTRODUODENOSCOPY      Family History  Problem Relation Age of Onset   Diabetes Mother    Heart disease Mother    Heart disease Father    Alcohol abuse Father    Diabetes Brother    Diabetes Brother    Liver disease Other        3 siblings   Colon cancer Neg Hx    Prostate cancer Neg Hx    Esophageal cancer Neg Hx    Rectal cancer Neg Hx    Stomach cancer Neg Hx     Social History   Socioeconomic History   Marital status: Married    Spouse name: Not on file   Number of children: 2   Years of education: Not on file   Highest education level: Not on file  Occupational History   Occupation: Retired Research scientist (physical sciences)   Occupation: Foster care in past   Occupation: Writing a book  Tobacco Use   Smoking status: Former    Types: Cigarettes    Quit date: 09/25/1968    Years since quitting: 54.3    Passive exposure: Never   Smokeless tobacco: Never  Vaping Use   Vaping Use: Never used  Substance and Sexual Activity   Alcohol use: Yes    Alcohol/week: 10.0 standard drinks of alcohol  Types: 10 Cans of beer per week   Drug use: Yes    Types: Marijuana   Sexual activity: Not on file  Other Topics Concern   Not on file  Social History Narrative   Married, 2 sons.        No living will   Would want wife to be his decision maker--alternate would be step daughter   Would accept resuscitation   Would accept feeding tube   Social Determinants of Health   Financial Resource Strain: Not on file  Food Insecurity: Not on file  Transportation Needs: Not on file  Physical Activity: Not on file  Stress: Not on file  Social Connections: Not on file  Intimate Partner Violence: Not on file   Review of Systems Stress has affected his appetite Sleeps okay Wears seat belt Teeth are okay--keeps up with dentist No suspicious skin  lesions---doesn't see derm Bowels move fine--no blood Voids okay--flow is fine and seems to empty okay No sig back or joint pains     Objective:   Physical Exam Constitutional:      Appearance: Normal appearance.  HENT:     Mouth/Throat:     Pharynx: No oropharyngeal exudate or posterior oropharyngeal erythema.  Eyes:     Conjunctiva/sclera: Conjunctivae normal.     Pupils: Pupils are equal, round, and reactive to light.  Cardiovascular:     Rate and Rhythm: Normal rate and regular rhythm.     Pulses: Normal pulses.     Heart sounds: No murmur heard.    No gallop.  Pulmonary:     Effort: Pulmonary effort is normal.     Breath sounds: Normal breath sounds. No wheezing or rales.  Abdominal:     Palpations: Abdomen is soft.     Tenderness: There is no abdominal tenderness.  Musculoskeletal:     Cervical back: Neck supple.     Right lower leg: No edema.     Left lower leg: No edema.  Lymphadenopathy:     Cervical: No cervical adenopathy.  Skin:    Findings: No lesion or rash.     Comments: No foot lesions  Neurological:     General: No focal deficit present.     Mental Status: He is alert and oriented to person, place, and time.     Comments: Word naming-- 5 then froze up Recall 2/3 Normal sensation in plantar feet  Psychiatric:        Mood and Affect: Mood normal.        Behavior: Behavior normal.            Assessment & Plan:

## 2023-01-08 NOTE — Assessment & Plan Note (Signed)
BP Readings from Last 3 Encounters:  01/08/23 132/68  06/09/22 (!) 168/90  04/10/22 126/70   Controlled on losartan/HCTZ 50/12.5mg  Due for labs

## 2023-01-14 ENCOUNTER — Other Ambulatory Visit: Payer: Self-pay | Admitting: Internal Medicine

## 2023-01-22 ENCOUNTER — Other Ambulatory Visit: Payer: Self-pay | Admitting: Internal Medicine

## 2023-07-16 ENCOUNTER — Encounter: Payer: Self-pay | Admitting: Internal Medicine

## 2023-07-16 ENCOUNTER — Ambulatory Visit: Payer: 59 | Admitting: Internal Medicine

## 2023-07-16 VITALS — BP 124/76 | HR 56 | Temp 98.7°F | Ht 72.25 in | Wt 227.0 lb

## 2023-07-16 DIAGNOSIS — Z23 Encounter for immunization: Secondary | ICD-10-CM | POA: Diagnosis not present

## 2023-07-16 DIAGNOSIS — E1159 Type 2 diabetes mellitus with other circulatory complications: Secondary | ICD-10-CM

## 2023-07-16 DIAGNOSIS — Z7984 Long term (current) use of oral hypoglycemic drugs: Secondary | ICD-10-CM | POA: Diagnosis not present

## 2023-07-16 DIAGNOSIS — E119 Type 2 diabetes mellitus without complications: Secondary | ICD-10-CM | POA: Insufficient documentation

## 2023-07-16 LAB — POCT GLYCOSYLATED HEMOGLOBIN (HGB A1C): Hemoglobin A1C: 6.9 % — AB (ref 4.0–5.6)

## 2023-07-16 NOTE — Assessment & Plan Note (Signed)
Lab Results  Component Value Date   HGBA1C 6.9 (A) 07/16/2023   Good control despite not being as compliant with healthy eating as he should be Discussed regular exercise Continue metformin 1000 daily

## 2023-07-16 NOTE — Progress Notes (Signed)
Subjective:    Patient ID: Alejandro Hoover, male    DOB: [birth year], 67 y.o.   MRN: [id number]  HPI Here for follow up of diabetes  Doing okay Still not doing great on the proper eating--trouble getting into a routine He and wife cook Not checking sugars much  Not much exercise No chest pain or SOB No dizziness or syncope  Current Outpatient Medications on File Prior to Visit  Medication Sig Dispense Refill   losartan-hydrochlorothiazide (HYZAAR) 50-12.5 MG tablet TAKE 1 TABLET BY MOUTH EVERY DAY 90 tablet 3   metFORMIN (GLUCOPHAGE-XR) 500 MG 24 hr tablet TAKE 2 TABLETS BY MOUTH EVERY DAY WITH BREAKFAST 180 tablet 3   omeprazole (PRILOSEC) 20 MG capsule TAKE 1 CAPSULE BY MOUTH EVERY DAY 90 capsule 3   rosuvastatin (CRESTOR) 10 MG tablet TAKE 1 TABLET BY MOUTH EVERY DAY 90 tablet 3   No current facility-administered medications on file prior to visit.    No Known Allergies  Past Medical History:  Diagnosis Date   Anxiety    Depression    Diabetes mellitus without complication (HCC)    ED (erectile dysfunction)    Gastritis    mild    GERD (gastroesophageal reflux disease)    Heart murmur    Hiatal hernia 09/28/2005   Hyperlipidemia    Hypertension    IBS (irritable bowel syndrome)     Past Surgical History:  Procedure Laterality Date   CARDIOVASCULAR STRESS TEST     neg   ESOPHAGOGASTRODUODENOSCOPY      Family History  Problem Relation Age of Onset   Diabetes Mother    Heart disease Mother    Heart disease Father    Alcohol abuse Father    Diabetes Brother    Diabetes Brother    Liver disease Other        3 siblings   Colon cancer Neg Hx    Prostate cancer Neg Hx    Esophageal cancer Neg Hx    Rectal cancer Neg Hx    Stomach cancer Neg Hx     Social History   Socioeconomic History   Marital status: Married    Spouse name: Not on file   Number of children: 2   Years of education: Not on file   Highest education level: Some college, no degree   Occupational History   Occupation: Retired Research scientist (physical sciences)   Occupation: Foster care in past   Occupation: Writing a book  Tobacco Use   Smoking status: Former    Current packs/day: 0.00    Types: Cigarettes    Quit date: 09/25/1968    Years since quitting: 54.8    Passive exposure: Never   Smokeless tobacco: Never  Vaping Use   Vaping status: Never Used  Substance and Sexual Activity   Alcohol use: Yes    Alcohol/week: 10.0 standard drinks of alcohol    Types: 10 Cans of beer per week   Drug use: Yes    Types: Marijuana   Sexual activity: Not on file  Other Topics Concern   Not on file  Social History Narrative   Married, 2 sons.        No living will   Would want wife to be his decision maker--alternate would be step daughter   Would accept resuscitation   Would accept feeding tube   Social Determinants of Health   Financial Resource Strain: Low Risk  (07/15/2023)   Overall Financial Resource Strain (CARDIA)  Difficulty of Paying Living Expenses: Not hard at all  Food Insecurity: No Food Insecurity (07/15/2023)   Hunger Vital Sign    Worried About Running Out of Food in the Last Year: Never true    Ran Out of Food in the Last Year: Never true  Transportation Needs: No Transportation Needs (07/15/2023)   PRAPARE - Administrator, Civil Service (Medical): No    Lack of Transportation (Non-Medical): No  Physical Activity: Unknown (07/15/2023)   Exercise Vital Sign    Days of Exercise per Week: 0 days    Minutes of Exercise per Session: Not on file  Stress: No Stress Concern Present (07/15/2023)   Alejandro Hoover    Feeling of Stress : Only a little  Social Connections: Moderately Isolated (07/15/2023)   Social Connection and Isolation Panel [NHANES]    Frequency of Communication with Friends and Family: Once a week    Frequency of Social Gatherings with Friends and Family: Once a week     Attends Religious Services: 1 to 4 times per year    Active Member of [employer] or Organizations: No    Attends Engineer, structural: Not on file    Marital Status: Married  Catering manager Violence: Not on file   Review of Systems Weight is about the same    Objective:   Physical Exam Constitutional:      Appearance: Normal appearance.  Cardiovascular:     Rate and Rhythm: Normal rate and regular rhythm.     Pulses: Normal pulses.     Heart sounds: No murmur heard.    No gallop.  Pulmonary:     Effort: Pulmonary effort is normal.     Breath sounds: Normal breath sounds. No wheezing or rales.  Musculoskeletal:     Cervical back: Neck supple.  Lymphadenopathy:     Cervical: No cervical adenopathy.  Skin:    Comments: No foot lesions  Neurological:     Mental Status: He is alert.            Assessment & Plan:

## 2024-02-02 ENCOUNTER — Other Ambulatory Visit: Payer: Self-pay | Admitting: Internal Medicine

## 2024-02-03 ENCOUNTER — Other Ambulatory Visit: Payer: Self-pay | Admitting: Internal Medicine

## 2024-02-04 NOTE — Telephone Encounter (Signed)
 lvm for pt to call office to schedule appt.

## 2024-02-04 NOTE — Telephone Encounter (Signed)
 Pt is past due CPE and Diabetes. Please help him get scheduled. Thanks.

## 2024-02-05 NOTE — Telephone Encounter (Signed)
 lvm for pt to call office to schedule appt.

## 2024-02-06 NOTE — Telephone Encounter (Signed)
 Called  pt and schedule a appt for cpe

## 2024-02-13 ENCOUNTER — Ambulatory Visit (INDEPENDENT_AMBULATORY_CARE_PROVIDER_SITE_OTHER): Admitting: Internal Medicine

## 2024-02-13 ENCOUNTER — Encounter: Payer: Self-pay | Admitting: Internal Medicine

## 2024-02-13 ENCOUNTER — Ambulatory Visit: Payer: Self-pay | Admitting: Internal Medicine

## 2024-02-13 VITALS — BP 108/72 | HR 68 | Temp 98.7°F | Ht 72.0 in | Wt 223.0 lb

## 2024-02-13 DIAGNOSIS — E0959 Drug or chemical induced diabetes mellitus with other circulatory complications: Secondary | ICD-10-CM

## 2024-02-13 DIAGNOSIS — Z0000 Encounter for general adult medical examination without abnormal findings: Secondary | ICD-10-CM

## 2024-02-13 DIAGNOSIS — K219 Gastro-esophageal reflux disease without esophagitis: Secondary | ICD-10-CM | POA: Diagnosis not present

## 2024-02-13 DIAGNOSIS — I10 Essential (primary) hypertension: Secondary | ICD-10-CM | POA: Diagnosis not present

## 2024-02-13 DIAGNOSIS — E785 Hyperlipidemia, unspecified: Secondary | ICD-10-CM

## 2024-02-13 DIAGNOSIS — Z7984 Long term (current) use of oral hypoglycemic drugs: Secondary | ICD-10-CM | POA: Diagnosis not present

## 2024-02-13 DIAGNOSIS — Z125 Encounter for screening for malignant neoplasm of prostate: Secondary | ICD-10-CM | POA: Diagnosis not present

## 2024-02-13 DIAGNOSIS — E119 Type 2 diabetes mellitus without complications: Secondary | ICD-10-CM

## 2024-02-13 DIAGNOSIS — D126 Benign neoplasm of colon, unspecified: Secondary | ICD-10-CM

## 2024-02-13 LAB — COMPREHENSIVE METABOLIC PANEL WITH GFR
ALT: 17 U/L (ref 0–53)
AST: 16 U/L (ref 0–37)
Albumin: 4.7 g/dL (ref 3.5–5.2)
Alkaline Phosphatase: 60 U/L (ref 39–117)
BUN: 13 mg/dL (ref 6–23)
CO2: 25 meq/L (ref 19–32)
Calcium: 9.5 mg/dL (ref 8.4–10.5)
Chloride: 103 meq/L (ref 96–112)
Creatinine, Ser: 0.95 mg/dL (ref 0.40–1.50)
GFR: 82.12 mL/min (ref >60.00)
Glucose, Bld: 109 mg/dL — ABNORMAL HIGH (ref 70–99)
Potassium: 3.8 meq/L (ref 3.5–5.1)
Sodium: 138 meq/L (ref 135–145)
Total Bilirubin: 0.8 mg/dL (ref 0.2–1.2)
Total Protein: 7.6 g/dL (ref 6.0–8.3)

## 2024-02-13 LAB — CBC
HCT: 43.1 % (ref 39.0–52.0)
Hemoglobin: 14.1 g/dL (ref 13.0–17.0)
MCHC: 32.7 g/dL (ref 30.0–36.0)
MCV: 85.1 fl (ref 78.0–100.0)
Platelets: 209 10*3/uL (ref 150.0–400.0)
RBC: 5.06 Mil/uL (ref 4.22–5.81)
RDW: 13.4 % (ref 11.5–15.5)
WBC: 7.4 10*3/uL (ref 4.0–10.5)

## 2024-02-13 LAB — PSA, MEDICARE: PSA: 0.4 ng/mL (ref 0.10–4.00)

## 2024-02-13 LAB — LIPID PANEL
Cholesterol: 127 mg/dL (ref 0–200)
HDL: 46.9 mg/dL (ref >39.00)
LDL Cholesterol: 55 mg/dL (ref 0–99)
NonHDL: 80.06
Total CHOL/HDL Ratio: 3
Triglycerides: 123 mg/dL (ref 0.0–149.0)
VLDL: 24.6 mg/dL (ref 0.0–40.0)

## 2024-02-13 LAB — HM DIABETES FOOT EXAM

## 2024-02-13 LAB — HEMOGLOBIN A1C: Hgb A1c MFr Bld: 7.4 % — ABNORMAL HIGH (ref 4.6–6.5)

## 2024-02-13 LAB — MICROALBUMIN / CREATININE URINE RATIO
Creatinine,U: 213.1 mg/dL
Microalb Creat Ratio: 46 mg/g — ABNORMAL HIGH (ref 0.0–30.0)
Microalb, Ur: 9.8 mg/dL — ABNORMAL HIGH (ref 0.0–1.9)

## 2024-02-13 NOTE — Assessment & Plan Note (Signed)
 Does well with rosuvastatin  10

## 2024-02-13 NOTE — Assessment & Plan Note (Signed)
 I have personally reviewed the Medicare Annual Wellness questionnaire and have noted 1. The patient's medical and social history 2. Their use of alcohol, tobacco or illicit drugs 3. Their current medications and supplements 4. The patient's functional ability including ADL's, fall risks, home safety risks and hearing or visual             impairment. 5. Diet and physical activities 6. Evidence for depression or mood disorders  The patients weight, height, BMI and visual acuity have been recorded in the chart I have made referrals, counseling and provided education to the patient based review of the above and I have provided the pt with a written personalized care plan for preventive services.  I have provided you with a copy of your personalized plan for preventive services. Please take the time to review along with your updated medication list.  Due for colonoscopy--will refer back to GI Will check PSA Flu vaccine in the fall--and consider RSV ?COVID vaccine Needs to start some exercise

## 2024-02-13 NOTE — Assessment & Plan Note (Signed)
 Hopefully still good control On metformin  1000 daily

## 2024-02-13 NOTE — Assessment & Plan Note (Signed)
 BP Readings from Last 3 Encounters:  02/13/24 108/72  07/16/23 124/76  01/08/23 132/68   Controlled on the losartan /hydrochlorothiazide 50/12.5 Will check labs

## 2024-02-13 NOTE — Progress Notes (Signed)
 Subjective:    Patient ID: Alejandro Hoover, male    DOB: 10-16-1955, 68 y.o.   MRN: [id number]  HPI Here for Medicare wellness visit and follow up of chronic health conditions Reviewed advanced directives Reviewed other doctors---Dr Dodd--dentist, Dr Magnolia Schroeder No hospitalizations or surgery Not exercising Vision is okay--seen yesterday Hearing is fair--only in right ear (long standing) Still enjoys light beer daily--no liquor now No tobacco products No falls No depression or anhedonia Independent with instrumental ADLs No memory problems  Having some back pain Can be in shoulder--trouble lifting left arm Some ankle pain --intermittently Then in back--notices it if he twists No exercise --discussed   Not checking sugars Is trying to eat right--cut back on sugar and alcohol No foot numbness, burning or tingling  No chest pain or SOB No dizziness or syncope No palpitations No edema  Rare heartburn Using the omeprazole  every few days No dysphagia  Current Outpatient Medications on File Prior to Visit  Medication Sig Dispense Refill   losartan -hydrochlorothiazide (HYZAAR) 50-12.5 MG tablet TAKE 1 TABLET BY MOUTH EVERY DAY 90 tablet 0   metFORMIN  (GLUCOPHAGE -XR) 500 MG 24 hr tablet TAKE 2 TABLETS BY MOUTH EVERY DAY WITH BREAKFAST 180 tablet 0   omeprazole  (PRILOSEC) 20 MG capsule TAKE 1 CAPSULE BY MOUTH EVERY DAY 90 capsule 0   rosuvastatin  (CRESTOR ) 10 MG tablet TAKE 1 TABLET BY MOUTH EVERY DAY 90 tablet 0   No current facility-administered medications on file prior to visit.    No Known Allergies  Past Medical History:  Diagnosis Date   Anxiety    Depression    Diabetes mellitus without complication (HCC)    ED (erectile dysfunction)    Gastritis    mild    GERD (gastroesophageal reflux disease)    Heart murmur    Hiatal hernia 09/28/2005   Hyperlipidemia    Hypertension    IBS (irritable bowel syndrome)     Past Surgical History:  Procedure  Laterality Date   CARDIOVASCULAR STRESS TEST     neg   ESOPHAGOGASTRODUODENOSCOPY      Family History  Problem Relation Age of Onset   Diabetes Mother    Heart disease Mother    Heart disease Father    Alcohol abuse Father    Diabetes Brother    Diabetes Brother    Liver disease Other        3 siblings   Colon cancer Neg Hx    Prostate cancer Neg Hx    Esophageal cancer Neg Hx    Rectal cancer Neg Hx    Stomach cancer Neg Hx     Social History   Socioeconomic History   Marital status: Married    Spouse name: Not on file   Number of children: 2   Years of education: Not on file   Highest education level: Some college, no degree  Occupational History   Occupation: Retired Research scientist (physical sciences)   Occupation: Foster care in past   Occupation: Writing a book  Tobacco Use   Smoking status: Former    Current packs/day: 0.00    Types: Cigarettes    Quit date: 09/25/1968    Years since quitting: 55.4    Passive exposure: Never   Smokeless tobacco: Never  Vaping Use   Vaping status: Never Used  Substance and Sexual Activity   Alcohol use: Yes    Alcohol/week: 10.0 standard drinks of alcohol    Types: 10 Cans of beer per week   Drug  use: Yes    Types: Marijuana   Sexual activity: Not on file  Other Topics Concern   Not on file  Social History Narrative   Married, 2 sons.        No living will   Would want wife to be his decision maker--alternate would be step daughter   Would accept resuscitation   Would accept feeding tube   Social Drivers of Health   Financial Resource Strain: Low Risk  (02/12/2024)   Overall Financial Resource Strain (CARDIA)    Difficulty of Paying Living Expenses: Not very hard  Food Insecurity: No Food Insecurity (02/12/2024)   Hunger Vital Sign    Worried About Running Out of Food in the Last Year: Never true    Ran Out of Food in the Last Year: Never true  Transportation Needs: No Transportation Needs (02/12/2024)   PRAPARE - Therapist, art (Medical): No    Lack of Transportation (Non-Medical): No  Physical Activity: Unknown (02/12/2024)   Exercise Vital Sign    Days of Exercise per Week: 0 days    Minutes of Exercise per Session: Not on file  Stress: No Stress Concern Present (02/12/2024)   [employer] of Occupational Health - Occupational Stress Questionnaire    Feeling of Stress : Only a little  Social Connections: Moderately Isolated (02/12/2024)   Social Connection and Isolation Panel [NHANES]    Frequency of Communication with Friends and Family: Once a week    Frequency of Social Gatherings with Friends and Family: Once a week    Attends Religious Services: 1 to 4 times per year    Active Member of [employer] or Organizations: No    Attends Engineer, structural: Not on file    Marital Status: Married  Catering manager Violence: Not on file   Review of Systems Appetite is good Weight stable Sleeps okay Teeth okay---keeps up Wears seat belt No suspicious skin lesions--no derm Bowels move okay--no blood Voids fine--stream is fine    Objective:   Physical Exam Constitutional:      Appearance: Normal appearance.  HENT:     Mouth/Throat:     Pharynx: No oropharyngeal exudate or posterior oropharyngeal erythema.  Eyes:     Conjunctiva/sclera: Conjunctivae normal.     Pupils: Pupils are equal, round, and reactive to light.  Cardiovascular:     Rate and Rhythm: Normal rate and regular rhythm.     Pulses: Normal pulses.     Heart sounds: No murmur heard.    No gallop.  Pulmonary:     Effort: Pulmonary effort is normal.     Breath sounds: Normal breath sounds. No wheezing or rales.  Abdominal:     Palpations: Abdomen is soft.     Tenderness: There is no abdominal tenderness.  Musculoskeletal:     Cervical back: Neck supple.     Right lower leg: No edema.     Left lower leg: No edema.  Lymphadenopathy:     Cervical: No cervical adenopathy.  Skin:    Findings: No lesion or  rash.     Comments: No foot lesions  Neurological:     General: No focal deficit present.     Mental Status: He is alert and oriented to person, place, and time.     Comments: Normal sensation in feet Mini-cog----normal  Psychiatric:        Mood and Affect: Mood normal.        Behavior: Behavior  normal.            Assessment & Plan:

## 2024-02-13 NOTE — Assessment & Plan Note (Signed)
 Controlled with omeprazole  about twice a week

## 2024-04-11 ENCOUNTER — Other Ambulatory Visit: Payer: Self-pay | Admitting: Internal Medicine

## 2024-04-11 ENCOUNTER — Ambulatory Visit: Payer: Self-pay | Admitting: Internal Medicine

## 2024-04-11 MED ORDER — LOSARTAN POTASSIUM-HCTZ 50-12.5 MG PO TABS: 1.0000 | ORAL_TABLET | Freq: Every day | ORAL | 0 refills | Status: AC

## 2024-04-11 MED ORDER — ROSUVASTATIN CALCIUM 10 MG PO TABS: 10.0000 mg | ORAL_TABLET | Freq: Every day | ORAL | 0 refills | Status: AC

## 2024-04-11 NOTE — Telephone Encounter (Signed)
 Rx sent electronically.

## 2024-04-11 NOTE — Telephone Encounter (Signed)
 FYI Only or Action Required?: Action required by provider: medication refill request.  Patient was last seen in primary care on 02/13/2024 by Jimmy Charlie FERNS, MD.  Called Nurse Triage reporting Fam emergency left meds at home, refill request.  Symptoms began out of town for one week, forgot meds.  Interventions attempted: Other: requesting refill for one week.  Symptoms are: NA.  Triage Disposition: Call PCP When Office is Open  Patient/caregiver understands and will follow disposition?:   Copied from CRM [id number]. Topic: Clinical - Medication Question >> Apr 11, 2024 10:45 AM Donna BRAVO wrote: Reason for CRM: patient is on vacation and left these medications at home. losartan -hydrochlorothiazide (HYZAAR) 50-12.5 MG tablet rosuvastatin  (CRESTOR ) 10 MG tablet  Patient wife is in rehab facility in [place], [place].Due to medical emergency patient is asking if another weeks worth of medication can bee called in to is asking if these could be called into this pharmacy: [facility] [address] [place], [place] [address] [phone] Reason for Disposition  [1] Prescription refill request for NON-ESSENTIAL medicine (i.e., no harm to patient if med not taken) AND [2] triager unable to refill per department policy  Answer Assessment - Initial Assessment Questions 1. DRUG NAME: What medicine do you need to have refilled?     Hyzaar, crestor    Additional info:  Patient wife is in rehab facility in [place], [place].Due to medical emergency patient is asking if another weeks worth of medication can be called in to [facility] [address] [place], [place] [address] [phone]  Protocols used: Medication Refill and Renewal Call-A-AH

## 2024-08-15 ENCOUNTER — Encounter: Admitting: Nurse Practitioner

## 2024-08-15 NOTE — Progress Notes (Deleted)
   Established Patient Office Visit  Subjective   Patient ID: Alejandro Hoover, male    DOB: June 07, 1956  Age: 68 y.o. MRN: [id number]  No chief complaint on file.   HPI  HTN: Patient currently maintained on losartan -hydrochlorothiazide 50-12.5 mg daily  DM2: Patient currently maintained on metformin  1000 mg every morning  GERD: Currently maintained on omeprazole  20 mg daily  HLD: Currently maintained on Crestor  10 mg daily  Tdap: 2017 Flu: COVID: Original with booster PNA: 2022 Shingles: Completed series  Colonoscopy: 10/29/2018, repeat 5 years PSA: 02/13/2024 LDCT: Aged out in regards to patient quit smoking in 1970    {History (Optional):23778}  ROS    Objective:     There were no vitals taken for this visit. {Vitals History (Optional):23777}  Physical Exam   No results found for any visits on 08/15/24.  {Labs (Optional):23779}  The ASCVD Risk score (Arnett DK, et al., 2019) failed to calculate for the following reasons:   The valid total cholesterol range is 130 to 320 mg/dL    Assessment & Plan:   Problem List Items Addressed This Visit   None   No follow-ups on file.    Adina Crandall, NP

## 2024-10-30 ENCOUNTER — Other Ambulatory Visit: Payer: Self-pay

## 2024-10-30 MED ORDER — METFORMIN HCL ER 500 MG PO TB24: 1000.0000 mg | ORAL_TABLET | Freq: Every day | ORAL | 0 refills | Status: AC

## 2024-10-30 NOTE — Telephone Encounter (Signed)
 Pt needs TOC with provider here at the office. Thank you

## 2024-10-30 NOTE — Telephone Encounter (Signed)
 Alejandro Hoover
